# Patient Record
Sex: Male | Born: 1945 | Race: White | Hispanic: No | Smoking: Never smoker
Health system: Southern US, Community
[De-identification: ages and names within clinical notes are randomized; demographics above are authoritative.]

## PROBLEM LIST (undated history)

## (undated) DIAGNOSIS — E785 Hyperlipidemia, unspecified: Secondary | ICD-10-CM

## (undated) DIAGNOSIS — F909 Attention-deficit hyperactivity disorder, unspecified type: Secondary | ICD-10-CM

## (undated) DIAGNOSIS — T7840XA Allergy, unspecified, initial encounter: Secondary | ICD-10-CM

## (undated) DIAGNOSIS — N400 Enlarged prostate without lower urinary tract symptoms: Secondary | ICD-10-CM

## (undated) DIAGNOSIS — F329 Major depressive disorder, single episode, unspecified: Secondary | ICD-10-CM

## (undated) DIAGNOSIS — F32A Depression, unspecified: Secondary | ICD-10-CM

## (undated) HISTORY — PX: VASECTOMY: SHX75

## (undated) HISTORY — DX: Attention-deficit hyperactivity disorder, unspecified type: F90.9

## (undated) HISTORY — DX: Allergy, unspecified, initial encounter: T78.40XA

## (undated) HISTORY — DX: Major depressive disorder, single episode, unspecified: F32.9

## (undated) HISTORY — DX: Hyperlipidemia, unspecified: E78.5

## (undated) HISTORY — DX: Depression, unspecified: F32.A

---

## 1970-12-14 HISTORY — PX: FRACTURE SURGERY: SHX138

## 2008-09-28 ENCOUNTER — Emergency Department (HOSPITAL_COMMUNITY): Admission: EM | Admit: 2008-09-28 | Discharge: 2008-09-28 | Payer: Self-pay | Admitting: Emergency Medicine

## 2010-09-26 ENCOUNTER — Encounter: Admission: RE | Admit: 2010-09-26 | Discharge: 2010-09-26 | Payer: Self-pay | Admitting: Emergency Medicine

## 2011-09-15 LAB — URINALYSIS, MICROSCOPIC ONLY
Glucose, UA: NEGATIVE
Ketones, ur: NEGATIVE
Protein, ur: NEGATIVE

## 2011-09-15 LAB — CBC
HCT: 47.4
MCV: 86.2
Platelets: 202
RDW: 14.4

## 2011-09-15 LAB — RAPID URINE DRUG SCREEN, HOSP PERFORMED
Benzodiazepines: NOT DETECTED
Cocaine: NOT DETECTED
Tetrahydrocannabinol: NOT DETECTED

## 2011-09-15 LAB — POCT I-STAT, CHEM 8
BUN: 13
Chloride: 108
Creatinine, Ser: 1.2
Glucose, Bld: 113 — ABNORMAL HIGH
Potassium: 3.9

## 2011-09-15 LAB — ETHANOL: Alcohol, Ethyl (B): 184 — ABNORMAL HIGH

## 2012-01-02 ENCOUNTER — Ambulatory Visit (INDEPENDENT_AMBULATORY_CARE_PROVIDER_SITE_OTHER): Payer: BC Managed Care – PPO

## 2012-01-02 DIAGNOSIS — R21 Rash and other nonspecific skin eruption: Secondary | ICD-10-CM

## 2012-01-02 DIAGNOSIS — R079 Chest pain, unspecified: Secondary | ICD-10-CM

## 2012-02-24 ENCOUNTER — Other Ambulatory Visit: Payer: Self-pay | Admitting: Internal Medicine

## 2012-04-05 ENCOUNTER — Ambulatory Visit (INDEPENDENT_AMBULATORY_CARE_PROVIDER_SITE_OTHER): Payer: BC Managed Care – PPO | Admitting: Internal Medicine

## 2012-04-05 VITALS — BP 122/74 | HR 52 | Temp 97.6°F | Resp 18 | Ht 68.25 in | Wt 154.2 lb

## 2012-04-05 DIAGNOSIS — F411 Generalized anxiety disorder: Secondary | ICD-10-CM

## 2012-04-05 DIAGNOSIS — N400 Enlarged prostate without lower urinary tract symptoms: Secondary | ICD-10-CM

## 2012-04-05 MED ORDER — TAMSULOSIN HCL 0.4 MG PO CAPS: 0.4000 mg | ORAL_CAPSULE | Freq: Every day | ORAL | Status: DC

## 2012-04-05 MED ORDER — ALPRAZOLAM 0.5 MG PO TABS: ORAL_TABLET | ORAL | Status: DC

## 2012-04-05 NOTE — Progress Notes (Signed)
  Subjective:    Patient ID: Thomas Mcbride, male    DOB: May 03, 1946, 66 y.o.   MRN: [id number]  HPIwife left him 3 days ago probably for another man/after 40 years Angry To the point of interfering with activity Feels grief/hypervent to point of carpopedal spasm Can't go to work/Afraid he'll break down was controlled with customers Grown kids-He is reluctant to discuss this with him Had problems about 10 or 15 years ago with another affair but he forgave her  History of depression the Wellbutrin in the past/he would prefer not to restart medicines at this point   Review of SystemsMedical problems hyperlipidemia and BPH, both stable     Objective:   Physical ExamVital signs stable Affect appropriately flat Attitude is stable        Assessment & Plan:  Angry and anxiety secondary to domestic issues with panic attacks  Plan-alprazolam 0.5 mg #42 take half to one 3 times a day as needed Consider counseling Followup 2 weeks to discuss further  Also needs to change his Flomax prescription for 90 day supplies

## 2012-04-06 ENCOUNTER — Telehealth: Payer: Self-pay

## 2012-04-06 NOTE — Telephone Encounter (Signed)
She is in a difficult position/her only recourse would be to discuss this with police to see if she needs to take out a restraining order/if she feels he is a danger to himself, she could ask them to commithim in voluntarily,Although she would have to appear before the magistrate to get this order/if he would agree to go to the behavioral [facility])Then she could tak him there, but he would have to agree

## 2012-04-06 NOTE — Telephone Encounter (Signed)
Spoke with Thomas Mcbride and she is concerned with her husband and his diagnosis from yesterday. She states he came to her house and drove by four times. She is concerned because she has a daycare and thinks he may be harm to others or himself. I told her we probably couldn't get him commited and that it would be up to the behavioral center to keep him there or not. She wants to know if he has appt to see a psychiatrist (this is what he told his family). Im not sure what Thomas Mcbride wants [place] to do right now, she is unsure of what to do. Can you advise me what to tell her.

## 2012-04-07 NOTE — Telephone Encounter (Signed)
Unable to reach.

## 2012-04-07 NOTE — Telephone Encounter (Signed)
Spoke with patient and let her know that we could not have him committed that she would need to call the police if she fears him.

## 2012-04-08 ENCOUNTER — Telehealth: Payer: Self-pay

## 2012-04-08 NOTE — Telephone Encounter (Signed)
PT WOULD LIKE RX FOR WELLBUTRIN.  BEST NUMBER IS [phone]

## 2013-02-23 ENCOUNTER — Telehealth: Payer: Self-pay

## 2013-02-23 NOTE — Telephone Encounter (Signed)
Patient would like to talk to a nurse about his irritable stomache and his nerve issues please call him at [phone]

## 2013-02-24 ENCOUNTER — Ambulatory Visit (INDEPENDENT_AMBULATORY_CARE_PROVIDER_SITE_OTHER): Payer: BC Managed Care – PPO | Admitting: Emergency Medicine

## 2013-02-24 ENCOUNTER — Ambulatory Visit: Payer: BC Managed Care – PPO

## 2013-02-24 VITALS — BP 120/81 | HR 69 | Temp 98.6°F | Resp 17 | Ht 68.2 in | Wt 147.0 lb

## 2013-02-24 DIAGNOSIS — T17308A Unspecified foreign body in larynx causing other injury, initial encounter: Secondary | ICD-10-CM

## 2013-02-24 DIAGNOSIS — K3189 Other diseases of stomach and duodenum: Secondary | ICD-10-CM

## 2013-02-24 DIAGNOSIS — T17320A Food in larynx causing asphyxiation, initial encounter: Secondary | ICD-10-CM

## 2013-02-24 DIAGNOSIS — K319 Disease of stomach and duodenum, unspecified: Secondary | ICD-10-CM

## 2013-02-24 MED ORDER — OMEPRAZOLE 40 MG PO CPDR: 40.0000 mg | DELAYED_RELEASE_CAPSULE | Freq: Every day | ORAL | Status: DC

## 2013-02-24 NOTE — Progress Notes (Signed)
  Subjective:    Patient ID: Thomas Mcbride, male    DOB: December 22, 1945, 67 y.o.   MRN: [id number]  HPI patient states he has been under a great deal of stress recently. His currently on Prozac daily. About 7 months ago he found out his wife was having an affair and he moved out. He states that when he eats and the food hits his stomach he has a salt sensation in his mouth and has gagging and vomited some yellowish-type material. He has a gripping sensation in his mid epigastrium. He states his weight is been stable he's been having normal bowel movements. He has never had any kind of GI screening.    Review of Systems     Objective:   Physical Exam HEENT exam is unremarkable. Neck is supple. Chest is clear to auscultation and percussion. Heart regular rate no murmurs rubs or gallops appreciated. Abdomen is flat liver and spleen are not enlarged there are no areas of tenderness no masses were felt. [facility] reading (PRIMARY) by  Dr.Maire Govan no acute disease        Assessment & Plan:  Will check a baseline chest x-ray. Have set him up to see Dr. Audley Hose. He would need endoscopy and colonoscopy. His symptoms are suspicious for an esophageal stricture or Schatzki ring

## 2013-02-24 NOTE — Telephone Encounter (Signed)
Spoke to patient and he states he is having trouble with his nerves and thinks this is causing him to experience trouble when eating. He feels like his food is causing his stomach to spasm. He has this occasionally, not daily this is more frequent sine his separation from his wife. Please advise.

## 2013-02-24 NOTE — Telephone Encounter (Signed)
Called patient to advise  °

## 2013-02-24 NOTE — Telephone Encounter (Signed)
Needs eval

## 2013-03-08 ENCOUNTER — Ambulatory Visit (INDEPENDENT_AMBULATORY_CARE_PROVIDER_SITE_OTHER): Payer: BC Managed Care – PPO | Admitting: Family Medicine

## 2013-03-08 VITALS — BP 128/82 | HR 67 | Temp 98.0°F | Resp 16 | Ht 67.0 in | Wt 146.4 lb

## 2013-03-08 DIAGNOSIS — L0231 Cutaneous abscess of buttock: Secondary | ICD-10-CM

## 2013-03-08 MED ORDER — DOXYCYCLINE HYCLATE 100 MG PO TABS: 100.0000 mg | ORAL_TABLET | Freq: Two times a day (BID) | ORAL | Status: DC

## 2013-03-08 NOTE — Patient Instructions (Signed)
Take doxycycline twice daily  If the place develops an abscess-like area come back.

## 2013-03-08 NOTE — Progress Notes (Signed)
Subjective: Patient has noticed a placed on his right buttock for the last several days. It started when he noticed an itching back there in the morning 3 days ago. There is a halo blood like material coming out of the lesion. It is continued to has discomfort and itching.  Objective: 5 cm area of erythema and induration but no central area of fluctuance. It feels like more a cellulitis than a abscess. It does have a central hole then some drainage is coming from, though nothing wants to express at this time  Assessment: Cellulitis right buttock, atypical appearance. I wondered whether he had gotten something stuck in himself there, though there is no evidence of that.  Plan: Doxycycline 100 mg twice a day. Return if it's getting all worse or developing more of an abscess-like area.

## 2013-05-02 ENCOUNTER — Ambulatory Visit (INDEPENDENT_AMBULATORY_CARE_PROVIDER_SITE_OTHER): Payer: BC Managed Care – PPO | Admitting: Emergency Medicine

## 2013-05-02 ENCOUNTER — Encounter: Payer: Self-pay | Admitting: Emergency Medicine

## 2013-05-02 ENCOUNTER — Other Ambulatory Visit: Payer: Self-pay | Admitting: Emergency Medicine

## 2013-05-02 VITALS — BP 140/83 | HR 55 | Temp 97.5°F | Resp 16 | Ht 67.0 in | Wt 146.0 lb

## 2013-05-02 DIAGNOSIS — N400 Enlarged prostate without lower urinary tract symptoms: Secondary | ICD-10-CM

## 2013-05-02 DIAGNOSIS — Z0000 Encounter for general adult medical examination without abnormal findings: Secondary | ICD-10-CM

## 2013-05-02 DIAGNOSIS — F32A Depression, unspecified: Secondary | ICD-10-CM

## 2013-05-02 DIAGNOSIS — Z23 Encounter for immunization: Secondary | ICD-10-CM

## 2013-05-02 DIAGNOSIS — F329 Major depressive disorder, single episode, unspecified: Secondary | ICD-10-CM

## 2013-05-02 DIAGNOSIS — E782 Mixed hyperlipidemia: Secondary | ICD-10-CM | POA: Insufficient documentation

## 2013-05-02 DIAGNOSIS — E785 Hyperlipidemia, unspecified: Secondary | ICD-10-CM

## 2013-05-02 LAB — POCT URINALYSIS DIPSTICK
Bilirubin, UA: NEGATIVE
Blood, UA: NEGATIVE
Nitrite, UA: NEGATIVE
Urobilinogen, UA: 0.2
pH, UA: 6

## 2013-05-02 LAB — COMPREHENSIVE METABOLIC PANEL
AST: 25 U/L (ref 0–37)
BUN: 18 mg/dL (ref 6–23)
Calcium: 9.6 mg/dL (ref 8.4–10.5)
Chloride: 101 mEq/L (ref 96–112)
Creat: 1.09 mg/dL (ref 0.50–1.35)

## 2013-05-02 LAB — LIPID PANEL
Cholesterol: 219 mg/dL — ABNORMAL HIGH (ref 0–200)
HDL: 71 mg/dL (ref >39)
LDL Cholesterol: 126 mg/dL — ABNORMAL HIGH (ref 0–99)
Triglycerides: 108 mg/dL (ref <150)

## 2013-05-02 LAB — IFOBT (OCCULT BLOOD): IFOBT: NEGATIVE

## 2013-05-02 MED ORDER — TAMSULOSIN HCL 0.4 MG PO CAPS: 0.4000 mg | ORAL_CAPSULE | Freq: Every day | ORAL | Status: DC

## 2013-05-02 MED ORDER — FLUOXETINE HCL 20 MG PO TABS: 20.0000 mg | ORAL_TABLET | Freq: Every day | ORAL | Status: DC

## 2013-05-02 NOTE — Patient Instructions (Signed)

## 2013-05-02 NOTE — Progress Notes (Deleted)
  Subjective:    Patient ID: Thomas Mcbride, male    DOB: Dec 24, 1945, 67 y.o.   MRN: [id number]  HPI    Review of Systems     Objective:   Physical Exam        Assessment & Plan:

## 2013-05-02 NOTE — Progress Notes (Signed)
@[facility] @  Patient ID: Thomas Mcbride MRN: [id number], DOB: Oct 21, 1946 67 y.o. Date of Encounter: 05/02/2013, 8:25 AM  Primary Physician: Lucilla Edin, MD  Chief Complaint: Physical (CPE)  HPI: 67 y.o. y/o male with history noted below here for CPE.  Doing well. No issues/complaints.  Review of Systems:  Consitutional: No fever, chills, fatigue, night sweats, lymphadenopathy, or weight changes. Eyes: No visual changes, eye redness, or discharge. ENT/Mouth: Ears: No otalgia, tinnitus, hearing loss, discharge. Nose: No congestion, rhinorrhea, sinus pain, or epistaxis. Throat: No sore throat, post nasal drip, or teeth pain. Cardiovascular: No CP, palpitations, diaphoresis, DOE, edema, orthopnea, PND. Respiratory: No cough, hemoptysis, SOB, or wheezing. Gastrointestinal: No anorexia, dysphagia, reflux, pain, nausea, vomiting, hematemesis, diarrhea, constipation, BRBPR, or melena. Genitourinary: No dysuria, frequency, urgency, hematuria, incontinence, nocturia, decreased urinary stream, discharge, impotence, or testicular pain/masses. Musculoskeletal: No decreased ROM, myalgias, stiffness, joint swelling, or weakness. Skin: No rash, erythema, lesion changes, pain, warmth, jaundice, or pruritis. Neurological: No headache, dizziness, syncope, seizures, tremors, memory loss, coordination problems, or paresthesias. Psychological: He currently works at [employer]. He went through a divorce over the last year and is now doing well. He continues on Prozac related to depression he had going through his separation and divorce.  Endocrine: No fatigue, polydipsia, polyphagia, polyuria, or known diabetes. He has a history of high cholesterol but had difficulty with hypoglycemia when he took statin drugs in the past All other systems were reviewed and are otherwise negative.  Past Medical History  Diagnosis Date  . Depression      Past Surgical History  Procedure Laterality Date  . Vasectomy    .  Fracture surgery      Home Meds:  Prior to Admission medications   Medication Sig Start Date End Date Taking? Authorizing Provider  Coenzyme Q10 (CO Q-10) 100 MG CAPS Take 1 tablet by mouth every other day.   Yes Historical Provider, MD  FLUoxetine (PROZAC) 20 MG tablet Take 20 mg by mouth daily.   Yes Historical Provider, MD  Multiple Vitamins-Minerals ([employer] SPECTRAVITE SENIOR PO) Take 1 tablet by mouth daily.   Yes Historical Provider, MD  Tamsulosin HCl (FLOMAX) 0.4 MG CAPS Take 1 capsule (0.4 mg total) by mouth daily. 04/05/12  Yes Tonye Pearson, MD  ALPRAZolam Prudy Feeler) 0.5 MG tablet Half to one tid for anxiety control 04/05/12   Tonye Pearson, MD  doxycycline (VIBRA-TABS) 100 MG tablet Take 1 tablet (100 mg total) by mouth 2 (two) times daily. 03/08/13   Peyton Najjar, MD  omeprazole (PRILOSEC) 40 MG capsule Take 1 capsule (40 mg total) by mouth daily. 02/24/13   [name], MD  simvastatin (ZOCOR) 10 MG tablet Take 10 mg by mouth at bedtime.    Historical Provider, MD    Allergies: No Known Allergies  History   Social History  . Marital Status: Legally Separated    Spouse Name: N/A    Number of Children: N/A  . Years of Education: N/A   Occupational History  . Not on file.   Social History Main Topics  . Smoking status: Never Smoker   . Smokeless tobacco: Not on file  . Alcohol Use: No  . Drug Use: No  . Sexually Active: No   Other Topics Concern  . Not on file   Social History Narrative  . No narrative on file    Family History  Problem Relation Age of Onset  . Crohn's disease Mother   . Cancer Mother   .  Heart attack Father   . Lymphoma Father   . COPD Paternal Grandfather     Physical Exam: Blood pressure 142/90, pulse 55, temperature 97.5 F (36.4 C), temperature source Oral, resp. rate 16, height 5\' 7"  (1.702 m), weight 146 lb (66.225 kg).  General: Well developed, well nourished, in no acute distress. HEENT: Normocephalic, atraumatic.  Conjunctiva pink, sclera non-icteric. Pupils 2 mm constricting to 1 mm, round, regular, and equally reactive to light and accomodation. EOMI. Internal auditory canal clear. TMs with good cone of light and without pathology. Nasal mucosa pink. Nares are without discharge. No sinus tenderness. Oral mucosa pink. Dentition. Pharynx without exudate.   Neck: Supple. Trachea midline. No thyromegaly. Full ROM. No lymphadenopathy. Lungs: Clear to auscultation bilaterally without wheezes, rales, or rhonchi. Breathing is of normal effort and unlabored. Cardiovascular: RRR with S1 S2. No murmurs, rubs, or gallops appreciated. Distal pulses 2+ symmetrically. No carotid or abdominal bruits. Abdomen: Soft, non-tender, non-distended with normoactive bowel sounds. No hepatosplenomegaly or masses. No rebound/guarding. No CVA tenderness. Without hernias.  Rectal: No external hemorrhoids or fissures. Rectal vault without masses.   Genitourinary:   circumcised male. No penile lesions. Testes descended bilaterally, and smooth without tenderness or masses.  Musculoskeletal: Full range of motion and 5/5 strength throughout. Without swelling, atrophy, tenderness, crepitus, or warmth. Extremities without clubbing, cyanosis, or edema. Calves supple. Skin: Warm and moist without erythema, ecchymosis, wounds, or rash. Neuro: A+Ox3. CN II-XII grossly intact. Moves all extremities spontaneously. Full sensation throughout. Normal gait. DTR 2+ throughout upper and lower extremities. Finger to nose intact. Psych:  Responds to questions appropriately with a normal affect.  There is a 0.5 x 0.5 cm raised red area lateral portion of the right side of the pelvis. Studies: CBC, CMET, Lipid, PSA,  all pending.        Assessment/Plan:  67 y.o. y/o white male here for physical exam. Routine labs were done. He is updated on his Tdap. Once I get back his lipid panel we will make a decision as to whether he should be on a statin. He is  willing to try this again. He was given a prescription for shingles vaccine. There is a small 4 mm cystic red area on the right side of the pelvis on the skin and if this continues to be present we will remove it by punch biopsy    -  Signed, Earl Lites, MD 05/02/2013 8:25 AM

## 2013-05-09 ENCOUNTER — Other Ambulatory Visit: Payer: Self-pay | Admitting: Radiology

## 2013-05-09 DIAGNOSIS — E785 Hyperlipidemia, unspecified: Secondary | ICD-10-CM

## 2013-05-09 MED ORDER — ATORVASTATIN CALCIUM 20 MG PO TABS: 20.0000 mg | ORAL_TABLET | Freq: Every day | ORAL | Status: DC

## 2013-10-13 ENCOUNTER — Telehealth: Payer: Self-pay | Admitting: *Deleted

## 2013-10-13 NOTE — Telephone Encounter (Signed)
Called and left message for patient to inform him that Tramadol script is ready for pick up at our front desk. Medication contract needs to be signed and UDS

## 2013-12-14 ENCOUNTER — Ambulatory Visit (INDEPENDENT_AMBULATORY_CARE_PROVIDER_SITE_OTHER): Payer: BC Managed Care – PPO | Admitting: Family Medicine

## 2013-12-14 VITALS — BP 128/72 | HR 52 | Temp 98.2°F | Resp 14 | Ht 69.0 in | Wt 148.0 lb

## 2013-12-14 DIAGNOSIS — J0100 Acute maxillary sinusitis, unspecified: Secondary | ICD-10-CM

## 2013-12-14 MED ORDER — AMOXICILLIN-POT CLAVULANATE 875-125 MG PO TABS: 1.0000 | ORAL_TABLET | Freq: Two times a day (BID) | ORAL | Status: DC

## 2013-12-14 MED ORDER — IPRATROPIUM BROMIDE 0.03 % NA SOLN: 2.0000 | Freq: Two times a day (BID) | NASAL | Status: DC

## 2013-12-14 NOTE — Patient Instructions (Signed)

## 2013-12-14 NOTE — Progress Notes (Signed)
Subjective:    Patient ID: Thomas Mcbride, male    DOB: 03/13/1946, 68 y.o.   MRN: [id number]  HPI Scribed for Nilda SimmerKristi Early MD, the patient was seen in room 3. This chart was scribed by Lewanda RifeAlexandra Hurtado, ED scribe. Patient's care was started at 11:41 AM  HPI Comments: Hx was provided by pt. Thomas Mcbride is a 68 y.o. male who presents to the [facility] complaining of constant worsening general malaise onset 12 days. Describes malaise and moderate in severity. Reports associated sinus pressure, mild cough, low back pain, chills, right sided otalgia, and intermittent congestion with yellow discharge. Denies any aggravating or alleviating factors. Reports trying Robitussin with no relief of symptoms. Denies associated fever, sore throat, and general myalgias.  Past Medical History  Diagnosis Date   Depression     Past Surgical History  Procedure Laterality Date   Vasectomy     Fracture surgery      Family History  Problem Relation Age of Onset   Crohn's disease Mother    Cancer Mother    Heart attack Father    Lymphoma Father    COPD Paternal Grandfather     History   Social History   Marital Status: Legally Separated    Spouse Name: N/A    Number of Children: N/A   Years of Education: N/A   Occupational History   CUSTOMER SALES [employer]   Social History Main Topics   Smoking status: Never Smoker    Smokeless tobacco: Not on file   Alcohol Use: No   Drug Use: No   Sexual Activity: No   Other Topics Concern   Not on file   Social History Narrative   Exercises 3 x's week on eliptical machine.          No Known Allergies  Patient Active Problem List   Diagnosis Date Noted   Other and unspecified hyperlipidemia 05/02/2013      Review of Systems  Constitutional: Negative for fever.  HENT: Positive for congestion, sinus pressure and sore throat.   Musculoskeletal: Positive for back pain.  Psychiatric/Behavioral:  Negative for confusion.   A complete 10 system review of systems was obtained and all systems are negative except as noted in the HPI and PMHx.       Objective:   Physical Exam  Nursing note and vitals reviewed. Constitutional: He is oriented to person, place, and time. He appears well-developed and well-nourished. No distress.  HENT:  Head: Normocephalic and atraumatic.  Right Ear: Tympanic membrane, external ear and ear canal normal.  Left Ear: Tympanic membrane and external ear normal.  Nose: No mucosal edema or rhinorrhea. Right sinus exhibits maxillary sinus tenderness. Right sinus exhibits no frontal sinus tenderness. Left sinus exhibits maxillary sinus tenderness. Left sinus exhibits no frontal sinus tenderness.  Mouth/Throat: Uvula is midline, oropharynx is clear and moist and mucous membranes are normal. Mucous membranes are not dry. No uvula swelling. No oropharyngeal exudate, posterior oropharyngeal edema, posterior oropharyngeal erythema or tonsillar abscesses.  Mild post-nasal drip present   Eyes: Conjunctivae and EOM are normal. Pupils are equal, round, and reactive to light. No scleral icterus.  Neck: Normal range of motion. Neck supple. No tracheal deviation present.  Cardiovascular: Normal rate and regular rhythm.   No murmur heard. Pulmonary/Chest: Effort normal and breath sounds normal. No respiratory distress. He has no wheezes. He has no rales.  Musculoskeletal: Normal range of motion.  Lymphadenopathy:    He has  no cervical adenopathy.  Neurological: He is alert and oriented to person, place, and time.  Skin: Skin is warm and dry. He is not diaphoretic.  Psychiatric: He has a normal mood and affect. His behavior is normal.    DIAGNOSTIC STUDIES: Oxygen Saturation is 97% on room air, normal by my interpretation.    COORDINATION OF CARE:  Nursing notes reviewed. Vital signs reviewed. Initial pt interview and examination performed.   11:49 AM-Discussed  treatment plan with pt at bedside. Pt agrees with plan.   Treatment plan initiated:Medications - No data to display   Initial diagnostic testing ordered.    1. Acute maxillary sinusitis         Assessment & Plan:  Acute maxillary sinusitis  1. Acute Maxillary Sinusitis: New.  Rx for Augmentin, Atrovent nasal spray provided.    Meds ordered this encounter  Medications   amoxicillin-clavulanate (AUGMENTIN) 875-125 MG per tablet    Sig: Take 1 tablet by mouth 2 (two) times daily.    Dispense:  20 tablet    Refill:  0   ipratropium (ATROVENT) 0.03 % nasal spray    Sig: Place 2 sprays into the nose 2 (two) times daily.    Dispense:  30 mL    Refill:  0    I personally performed the services described in this documentation, which was scribed in my presence.  The recorded information has been reviewed and is accurate.  Nilda Simmer, M.D.  [facility] [address] [place], [place]  [address] [phone] phone [phone] fax

## 2014-02-15 ENCOUNTER — Other Ambulatory Visit: Payer: Self-pay | Admitting: Emergency Medicine

## 2014-05-07 ENCOUNTER — Other Ambulatory Visit: Payer: Self-pay | Admitting: Emergency Medicine

## 2014-05-14 ENCOUNTER — Other Ambulatory Visit: Payer: Self-pay | Admitting: Emergency Medicine

## 2014-06-22 ENCOUNTER — Ambulatory Visit (INDEPENDENT_AMBULATORY_CARE_PROVIDER_SITE_OTHER): Payer: BC Managed Care – PPO | Admitting: Emergency Medicine

## 2014-06-22 VITALS — BP 108/52 | HR 49 | Temp 97.8°F | Resp 16 | Ht 69.25 in | Wt 158.2 lb

## 2014-06-22 DIAGNOSIS — F3289 Other specified depressive episodes: Secondary | ICD-10-CM

## 2014-06-22 DIAGNOSIS — F32A Depression, unspecified: Secondary | ICD-10-CM | POA: Insufficient documentation

## 2014-06-22 DIAGNOSIS — N400 Enlarged prostate without lower urinary tract symptoms: Secondary | ICD-10-CM

## 2014-06-22 DIAGNOSIS — F329 Major depressive disorder, single episode, unspecified: Secondary | ICD-10-CM

## 2014-06-22 MED ORDER — FLUOXETINE HCL 10 MG PO CAPS: ORAL_CAPSULE | ORAL | Status: DC

## 2014-06-22 MED ORDER — TAMSULOSIN HCL 0.4 MG PO CAPS: 0.4000 mg | ORAL_CAPSULE | Freq: Every day | ORAL | Status: DC

## 2014-06-22 NOTE — Progress Notes (Signed)
   Subjective:    Patient ID: Thomas Mcbride, male    DOB: 11/11/1946, 68 y.o.   MRN: [id number] This chart was scribed for Lesle ChrisSteven Mirely Pangle, MD by Nicholos Johnsenise Iheanachor, Medical Scribe. The patient was seen in room 2. This patient's care was started at 10:55 AM.  HPI HPI Comments: Thomas MatterDonald Weekly is a 68 y.o. male who presents to the [facility] for medication refill of Fluoxetine HCl 20 mg.  Does not feel as if Fluoxetine is working for him. Says he hs been on for over a year. Feeling more lethargic and uninterested in doing anything for the past 2-3 months. Says he exercises 2-3 times week at the gym but otherwise no other hobbies or activities he performs regularly. Says he is trying to involve himself in his community more since getting divorced. Was taking Wellbutrin at first but changed over to Fluoxetine due to lone Wellbutrin sxs. Does report he felt as if there was a perfect balance at the time he switched over from Wellbutrin to Fluoxetine and both drugs were in his system. Reports ADD sxs were even diminished at that time. Has not felt that way being diagnosed with depression. Last full PE was May 2014  Review of Systems  Psychiatric/Behavioral: Negative.    Objective:  Physical Exam CONSTITUTIONAL: Well developed/well nourished HEAD: Normocephalic/atraumatic EYES: EOMI/PERRL ENMT: Mucous membranes moist NECK: supple no meningeal signs SPINE:entire spine nontender CV: S1/S2 noted, no murmurs/rubs/gallops noted LUNGS: Lungs are clear to auscultation bilaterally, no apparent distress ABDOMEN: soft, nontender, no rebound or guarding GU:no cva tenderness NEURO: Pt is awake/alert, moves all extremitiesx4 EXTREMITIES: pulses normal, full ROM SKIN: warm, color normal PSYCH: no abnormalities of mood noted  Assessment & Plan:  Patient states his Prozac is not as effective as previously. Interested in increasing the dosage. Will increase to 30 mg daily. Advised to make  appointment for physical.   I personally performed the services described in this documentation, which was scribed in my presence. The recorded information has been reviewed and is accurate.

## 2014-07-25 ENCOUNTER — Other Ambulatory Visit: Payer: Self-pay | Admitting: Emergency Medicine

## 2014-07-27 ENCOUNTER — Telehealth: Payer: Self-pay

## 2014-07-27 NOTE — Telephone Encounter (Signed)
°  Pt is needing refill on cyclobenzaprine (FLEXERIL) 10 MG tablet [[id number]][[id number]] , [facility] is saying they do not have it

## 2014-07-27 NOTE — Telephone Encounter (Signed)
Dr Cleta Albertsaub, I do not see this med on pt's med list or that you have Rxd it before. Do you Rx this for pt, or want to?

## 2014-07-28 MED ORDER — FLUOXETINE HCL 10 MG PO CAPS: ORAL_CAPSULE | ORAL | Status: DC

## 2014-07-28 NOTE — Telephone Encounter (Signed)
Please call and get more information regarding the Flexeril. He may have been given that at sometime in the past but get a better history of what problem is going on now that he is requesting this medication.

## 2014-07-28 NOTE — Telephone Encounter (Signed)
Pt needs a RF of Fluoxetine not Flexeril. Per Maralyn SagoSarah, ok to RF until pt's appt on 10/20.

## 2014-08-04 ENCOUNTER — Other Ambulatory Visit: Payer: Self-pay | Admitting: Emergency Medicine

## 2014-08-06 NOTE — Telephone Encounter (Signed)
Pt is very overdue for f/up, but has CPE sch for 10/02/14. Do you want to RF through then? Pended.

## 2014-08-16 DIAGNOSIS — F902 Attention-deficit hyperactivity disorder, combined type: Secondary | ICD-10-CM | POA: Insufficient documentation

## 2014-08-18 ENCOUNTER — Ambulatory Visit (INDEPENDENT_AMBULATORY_CARE_PROVIDER_SITE_OTHER): Payer: Medicare Other | Admitting: Emergency Medicine

## 2014-08-18 VITALS — BP 142/72 | HR 52 | Temp 98.3°F | Resp 16 | Ht 67.25 in | Wt 147.4 lb

## 2014-08-18 DIAGNOSIS — F32A Depression, unspecified: Secondary | ICD-10-CM

## 2014-08-18 DIAGNOSIS — F3289 Other specified depressive episodes: Secondary | ICD-10-CM

## 2014-08-18 DIAGNOSIS — E785 Hyperlipidemia, unspecified: Secondary | ICD-10-CM

## 2014-08-18 DIAGNOSIS — F329 Major depressive disorder, single episode, unspecified: Secondary | ICD-10-CM

## 2014-08-18 MED ORDER — ATORVASTATIN CALCIUM 20 MG PO TABS: ORAL_TABLET | ORAL | Status: DC

## 2014-08-18 MED ORDER — FLUOXETINE HCL 10 MG PO CAPS: ORAL_CAPSULE | ORAL | Status: DC

## 2014-08-18 NOTE — Progress Notes (Addendum)
   Subjective:    Patient ID: Thomas Mcbride, male    DOB: [birth year], 68 y.o.   MRN: [id number]  This chart was scribed for Lesle Chris, MD by Jarvis Morgan, Medical Scribe. This patient was seen in Room 8 and the patient's care was started at 9:42 AM.  Chief Complaint  Patient presents with  . Depression    Has been having a rough time emotionally recently- would like to discuss possible change in medication   . ADHD    Saw a specialist at [facility] and would like to discuss what he recommends      HPI HPI Comments: Thomas Mcbride is a 68 y.o. male with a h/o depression and hyperlipidemia who presents to [facility] to discuss his depression medications. He states that Medicare has not been covering his prescriptions due to his income. He also notes he has not shown any improvement with the 10 mg dose increase of his Prozac medication. At his last visit his Prozac dose was increased from 20-30 mg. He has previously taken Wellbutrin for his depression issues and began having some side effects. He reports he felt like he was getting hypoglycemic and feeling really weak. When he would eat he states that the feeling would go away. He says he has been seeing a specialist at [facility] for his ADHD and depression, Dr, Veneda Melter. Dr. Alvina Filbert recommended him to maybe drop back down to 20 mg Prozac and then also try taking Wellbutrin again. This specialist believes that the hypoglycemia was an atypical side effect and if it was tried again it was possible he may not have that side effect again. Pt reports he has really good results from Wellbutrin in terms of helping his depression. He reports that his family members have noticed some increased depression symptoms. He states that he has noticed feeling more fatigued, withdrawn and just overall not wanting to do anything. He wanted to discuss this with Dr. Cleta Alberts. Pt is also taking Lipitor for his hyperlipidemia and needs to get a new prescription for that.       Review of Systems  Constitutional: Positive for fatigue (due to depression).  Gastrointestinal: Negative for nausea, vomiting and diarrhea.  Neurological: Negative for dizziness, weakness, light-headedness and headaches.  Psychiatric/Behavioral: Positive for dysphoric mood. Negative for confusion and sleep disturbance.       Intermittent anhedonia due to depression       Objective:   Physical Exam CONSTITUTIONAL: Well developed/well nourished HEAD: Normocephalic/atraumatic EYES: EOMI/PERRL ENMT: Mucous membranes moist NECK: supple no meningeal signs SPINE:entire spine nontender CV: S1/S2 noted, no murmurs/rubs/gallops noted LUNGS: Lungs are clear to auscultation bilaterally, no apparent distress ABDOMEN: soft, nontender, no rebound or guarding GU:no cva tenderness NEURO: Pt is awake/alert, moves all extremitiesx4 EXTREMITIES: pulses normal, full ROM SKIN: warm, color normal PSYCH: no abnormalities of mood noted      Assessment & Plan:  I refilled the patient's Lipitor. Decrease Prozac to 20 mg per day. Advised patient to see Dr. Donell Beers psychiatry  to discuss the use of Wellbutrin with Prozac. Patient advised of the increased seizure risk with this combination.

## 2014-08-18 NOTE — Patient Instructions (Signed)
Please make an appointment to see Dr. Donell Beers [phone]. Decrease your prozac to  a day. Continue lipitor. Keep your appointment in October.

## 2014-08-26 ENCOUNTER — Emergency Department (HOSPITAL_COMMUNITY)
Admission: EM | Admit: 2014-08-26 | Discharge: 2014-08-26 | Disposition: A | Discharge status: Home / Self Care | Payer: Medicare Other | Attending: Emergency Medicine | Admitting: Emergency Medicine

## 2014-08-26 ENCOUNTER — Emergency Department (HOSPITAL_COMMUNITY): Payer: Medicare Other

## 2014-08-26 ENCOUNTER — Encounter (HOSPITAL_COMMUNITY): Payer: Self-pay | Admitting: Emergency Medicine

## 2014-08-26 DIAGNOSIS — R079 Chest pain, unspecified: Secondary | ICD-10-CM | POA: Insufficient documentation

## 2014-08-26 DIAGNOSIS — Z79899 Other long term (current) drug therapy: Secondary | ICD-10-CM | POA: Diagnosis not present

## 2014-08-26 DIAGNOSIS — E785 Hyperlipidemia, unspecified: Secondary | ICD-10-CM | POA: Insufficient documentation

## 2014-08-26 DIAGNOSIS — R110 Nausea: Secondary | ICD-10-CM | POA: Diagnosis not present

## 2014-08-26 DIAGNOSIS — Z7982 Long term (current) use of aspirin: Secondary | ICD-10-CM | POA: Insufficient documentation

## 2014-08-26 DIAGNOSIS — F329 Major depressive disorder, single episode, unspecified: Secondary | ICD-10-CM | POA: Diagnosis not present

## 2014-08-26 DIAGNOSIS — R61 Generalized hyperhidrosis: Secondary | ICD-10-CM | POA: Insufficient documentation

## 2014-08-26 DIAGNOSIS — F3289 Other specified depressive episodes: Secondary | ICD-10-CM | POA: Diagnosis not present

## 2014-08-26 DIAGNOSIS — R42 Dizziness and giddiness: Secondary | ICD-10-CM | POA: Insufficient documentation

## 2014-08-26 LAB — CBC
HCT: 42.6 % (ref 39.0–52.0)
Hemoglobin: 14.2 g/dL (ref 13.0–17.0)
MCH: 28.3 pg (ref 26.0–34.0)
MCHC: 33.3 g/dL (ref 30.0–36.0)
MCV: 84.9 fL (ref 78.0–100.0)
Platelets: 215 10*3/uL (ref 150–400)
RBC: 5.02 MIL/uL (ref 4.22–5.81)
RDW: 13.7 % (ref 11.5–15.5)
WBC: 6.1 10*3/uL (ref 4.0–10.5)

## 2014-08-26 LAB — BASIC METABOLIC PANEL
Anion gap: 15 (ref 5–15)
BUN: 20 mg/dL (ref 6–23)
CO2: 24 meq/L (ref 19–32)
Calcium: 9.1 mg/dL (ref 8.4–10.5)
Chloride: 99 mEq/L (ref 96–112)
Creatinine, Ser: 0.95 mg/dL (ref 0.50–1.35)
GFR calc non Af Amer: 85 mL/min — ABNORMAL LOW (ref >90)
Glucose, Bld: 144 mg/dL — ABNORMAL HIGH (ref 70–99)
Potassium: 4.1 mEq/L (ref 3.7–5.3)
Sodium: 138 mEq/L (ref 137–147)

## 2014-08-26 LAB — I-STAT TROPONIN, ED: Troponin i, poc: 0 ng/mL (ref 0.00–0.08)

## 2014-08-26 NOTE — ED Notes (Signed)
Pt reports that he was at work today and about 1030 this morning he started feeling light-headed, had chest pain and nauseated. Reports no cardiac hx but reports that his father had cardiac hx. No pain at this time

## 2014-08-26 NOTE — ED Provider Notes (Signed)
CSN: [id number]     Arrival date & time 08/26/14  1116 History   First MD Initiated Contact with Patient 08/26/14 1152     Chief Complaint  Patient presents with  . Chest Pain     (Consider location/radiation/quality/duration/timing/severity/associated sxs/prior Treatment) HPI Thomas Mcbride is a 68 y.o. male with history of hyperlipidemia and depression, who presents to ED with complaint of chest pain. Pt states yesterday, he was scraping off tiles with left hand what developed sudden onset of diaphoresis and dizziness, with nausea. States he stopped and his symptoms resolved. Today pt was standing at work, when he developed similar episode but this time with some pain in left shoulder. Pt states he became concerned and decided to come to ED. States his father had MI in his 36s. He denies any known cardiac problems. He took aspirin and came to ED. Currently asymptomatic. Pt denies smoking. He is not a drinker. States used to exercise strenuously, stopped because of some depression issues. Also reports starting Flomax yesterday. Took one dose yesterday and one today.    Past Medical History  Diagnosis Date  . Depression   . Attention deficit hyperactivity disorder (ADHD)    Past Surgical History  Procedure Laterality Date  . Vasectomy    . Fracture surgery     Family History  Problem Relation Age of Onset  . Crohn's disease Mother   . Cancer Mother   . Heart attack Father   . Lymphoma Father   . COPD Paternal Grandfather    History  Substance Use Topics  . Smoking status: Never Smoker   . Smokeless tobacco: Not on file  . Alcohol Use: No    Review of Systems  Constitutional: Positive for diaphoresis. Negative for fever and chills.  Respiratory: Positive for chest tightness. Negative for cough and shortness of breath.   Cardiovascular: Positive for chest pain. Negative for palpitations and leg swelling.  Gastrointestinal: Positive for nausea. Negative for vomiting, abdominal  pain, diarrhea and abdominal distention.  Genitourinary: Negative for dysuria.  Musculoskeletal: Negative for arthralgias, myalgias, neck pain and neck stiffness.  Skin: Negative for rash.  Allergic/Immunologic: Negative for immunocompromised state.  Neurological: Positive for dizziness and light-headedness. Negative for weakness, numbness and headaches.      Allergies  Review of patient's allergies indicates no known allergies.  Home Medications   Prior to Admission medications   Medication Sig Start Date End Date Taking? Authorizing Provider  aspirin EC 81 MG tablet Take 81 mg by mouth daily.   Yes Historical Provider, MD  atorvastatin (LIPITOR) 20 MG tablet Take 20 mg by mouth daily.   Yes Historical Provider, MD  Coenzyme Q10 (CO Q-10) 100 MG CAPS Take 1 tablet by mouth daily.    Yes Historical Provider, MD  FLUoxetine (PROZAC) 20 MG capsule Take 20 mg by mouth daily.   Yes Historical Provider, MD  L-TYROSINE PO Take 1 tablet by mouth daily.   Yes Historical Provider, MD  Multiple Vitamins-Minerals ([employer] SPECTRAVITE SENIOR PO) Take 1 tablet by mouth daily.   Yes Historical Provider, MD  tamsulosin (FLOMAX) 0.4 MG CAPS capsule Take 1 capsule (0.4 mg total) by mouth daily. 06/22/14  Yes [name], MD   BP 139/83  Pulse 51  Temp(Src) 98.1 F (36.7 C) (Oral)  Resp 11  SpO2 99% Physical Exam  Nursing note and vitals reviewed. Constitutional: He appears well-developed and well-nourished. No distress.  HENT:  Head: Normocephalic and atraumatic.  Eyes: Conjunctivae are normal.  Neck: Neck supple.  Cardiovascular: Normal rate, regular rhythm, normal heart sounds and intact distal pulses.   Pulmonary/Chest: Effort normal. No respiratory distress. He has no wheezes. He has no rales. He exhibits no tenderness.  Abdominal: Soft. Bowel sounds are normal. He exhibits no distension. There is no tenderness. There is no rebound.  Musculoskeletal: He exhibits no edema.  Neurological: He  is alert.  Skin: Skin is warm and dry.    ED Course  Procedures (including critical care time) Labs Review Labs Reviewed  BASIC METABOLIC PANEL - Abnormal; Notable for the following:    Glucose, Bld 144 (*)    GFR calc non Af Amer 85 (*)    All other components within normal limits  CBC  PRO B NATRIURETIC PEPTIDE  I-STAT TROPOININ, ED    Imaging Review Dg Chest 2 View  08/26/2014   CLINICAL DATA:  Chest pain radiating to left arm and fingers  EXAM: CHEST  2 VIEW  COMPARISON:  Chest radiograph 02/24/2013  FINDINGS: The heart, mediastinal contours, and hilar contours are within normal limits. Pulmonary vascularity is normal. Lungs are well expanded and clear. No acute or suspicious osseous abnormality. Chronic degenerative changes of the left shoulder noted.  IMPRESSION: No active cardiopulmonary disease.   Electronically Signed   By: Britta Mccreedy M.D.   On: 08/26/2014 13:12     EKG Interpretation None      MDM   Final diagnoses:  Chest pain, unspecified chest pain type    Patient with somewhat atypical chest pain. Currently chest pain-free. She is bradycardic, otherwise normal vital signs. EKG normal shows sinus bradycardia. Will check labs, chest x-ray, most likely symptoms due to Flomax but will need to do cardiac workup given family history of early heart disease.   2:40 PM Labs: Troponin, chest x-ray unremarkable. EKG normal. Discussed with Dr. Effie Shy who has seen patient and who believes that patient is stable for discharge home at this time.  Patient's heart score is 3, he is considered to be low risk for cardiac disease. Patient has been pain-free. He will stop Flomax. He will followup with his primary care Dr. He is instructed to return to emergency department if his symptoms worsen.  Filed Vitals:   08/26/14 1330 08/26/14 1345 08/26/14 1350 08/26/14 1400  BP: 128/88 144/78 144/78 139/83  Pulse: 53 51  51  Temp:      TempSrc:      Resp: SpO2: 99% 99%  99% 99%      Lottie Mussel, PA-C 08/26/14 1555

## 2014-08-26 NOTE — Discharge Instructions (Signed)
Continue aspirin daily. Follow up with primary care doctor for recheck as soon as able. Return if worsening.    Chest Pain (Nonspecific) It is often hard to give a specific diagnosis for the cause of chest pain. There is always a chance that your pain could be related to something serious, such as a heart attack or a blood clot in the lungs. You need to follow up with your health care provider for further evaluation. CAUSES   Heartburn.  Pneumonia or bronchitis.  Anxiety or stress.  Inflammation around your heart (pericarditis) or lung (pleuritis or pleurisy).  A blood clot in the lung.  A collapsed lung (pneumothorax). It can develop suddenly on its own (spontaneous pneumothorax) or from trauma to the chest.  Shingles infection (herpes zoster virus). The chest wall is composed of bones, muscles, and cartilage. Any of these can be the source of the pain.  The bones can be bruised by injury.  The muscles or cartilage can be strained by coughing or overwork.  The cartilage can be affected by inflammation and become sore (costochondritis). DIAGNOSIS  Lab tests or other studies may be needed to find the cause of your pain. Your health care provider may have you take a test called an ambulatory electrocardiogram (ECG). An ECG records your heartbeat patterns over a 24-hour period. You may also have other tests, such as:  Transthoracic echocardiogram (TTE). During echocardiography, sound waves are used to evaluate how blood flows through your heart.  Transesophageal echocardiogram (TEE).  Cardiac monitoring. This allows your health care provider to monitor your heart rate and rhythm in real time.  Holter monitor. This is a portable device that records your heartbeat and can help diagnose heart arrhythmias. It allows your health care provider to track your heart activity for several days, if needed.  Stress tests by exercise or by giving medicine that makes the heart beat  faster. TREATMENT   Treatment depends on what may be causing your chest pain. Treatment may include:  Acid blockers for heartburn.  Anti-inflammatory medicine.  Pain medicine for inflammatory conditions.  Antibiotics if an infection is present.  You may be advised to change lifestyle habits. This includes stopping smoking and avoiding alcohol, caffeine, and chocolate.  You may be advised to keep your head raised (elevated) when sleeping. This reduces the chance of acid going backward from your stomach into your esophagus. Most of the time, nonspecific chest pain will improve within 2-3 days with rest and mild pain medicine.  HOME CARE INSTRUCTIONS   If antibiotics were prescribed, take them as directed. Finish them even if you start to feel better.  For the next few days, avoid physical activities that bring on chest pain. Continue physical activities as directed.  Do not use any tobacco products, including cigarettes, chewing tobacco, or electronic cigarettes.  Avoid drinking alcohol.  Only take medicine as directed by your health care provider.  Follow your health care provider's suggestions for further testing if your chest pain does not go away.  Keep any follow-up appointments you made. If you do not go to an appointment, you could develop lasting (chronic) problems with pain. If there is any problem keeping an appointment, call to reschedule. SEEK MEDICAL CARE IF:   Your chest pain does not go away, even after treatment.  You have a rash with blisters on your chest.  You have a fever. SEEK IMMEDIATE MEDICAL CARE IF:   You have increased chest pain or pain that spreads to your  arm, neck, jaw, back, or abdomen.  You have shortness of breath.  You have an increasing cough, or you cough up blood.  You have severe back or abdominal pain.  You feel nauseous or vomit.  You have severe weakness.  You faint.  You have chills. This is an emergency. Do not wait to  see if the pain will go away. Get medical help at once. Call your local emergency services (911 in [place] Do not drive yourself to the hospital. MAKE SURE YOU:   Understand these instructions.  Will watch your condition.  Will get help right away if you are not doing well or get worse. Document Released: 09/09/2005 Document Revised: 12/05/2013 Document Reviewed: 07/05/2008 [facility] Patient Information 2015 [place], [place]. This information is not intended to replace advice given to you by your health care provider. Make sure you discuss any questions you have with your health care provider.

## 2014-08-26 NOTE — ED Provider Notes (Signed)
  Face-to-face evaluation   History: He is here for evaluation of 2 episodes of dizziness, one yesterday, and one today. Both were short lived. He started Flomax yesterday The episode today was accompanied by discomfort in the left upper chest, and left shoulder. This also resolved spontaneously. There is no diaphoresis or shortness of breath,  Physical exam: Alert, calm, cooperative. He is asymptomatic at this time. Repeat blood pressure, on the right arm, 146/80  Assessment- this is associated with use of Flomax. Doubt ACS, PE, or pneumonia. I suspect mild hypotension, that is transient. He, stable for discharge with outpatient followup with his PCP  Medical screening examination/treatment/procedure(s) were conducted as a shared visit with non-physician practitioner(s) and myself.  I personally evaluated the patient during the encounter  Flint Melter, MD 08/26/14 2144

## 2014-08-30 ENCOUNTER — Telehealth: Payer: Self-pay

## 2014-08-30 NOTE — Telephone Encounter (Signed)
Pt of Dr. Cleta Alberts was recently admitted to the ER for symptoms similar to a heart attack, they ruled this out at the hospital. Pt has an upcoming appt. With Dr. Cleta Alberts in October, but would like for him to take a look at his notes from the er, and if he sees anything that seemed to be alarming, the pt said he would come in before then if need. If not pt would just like him to be aware of this ER visit before his upcomming PE

## 2014-08-31 NOTE — Telephone Encounter (Signed)
Call patient and ask him to see me Sunday at 104 as a walk in patient.

## 2014-09-01 NOTE — Telephone Encounter (Signed)
LMOM to RTC tomorrow.

## 2014-09-02 ENCOUNTER — Ambulatory Visit (INDEPENDENT_AMBULATORY_CARE_PROVIDER_SITE_OTHER): Payer: Medicare Other | Admitting: Emergency Medicine

## 2014-09-02 VITALS — BP 128/68 | HR 56 | Temp 97.7°F | Resp 20 | Ht 67.5 in | Wt 149.0 lb

## 2014-09-02 DIAGNOSIS — R079 Chest pain, unspecified: Secondary | ICD-10-CM

## 2014-09-02 DIAGNOSIS — R9431 Abnormal electrocardiogram [ECG] [EKG]: Secondary | ICD-10-CM

## 2014-09-02 NOTE — Progress Notes (Addendum)
Subjective:    Patient ID: Thomas Mcbride, male    DOB: January 05, 1946, 68 y.o.   MRN: [id number] This chart was scribed for Viviann Spare A. Cleta Alberts, MD by Chestine Spore, ED Scribe. The patient was seen in room 13 at 2:01 PM.   Chief Complaint  Patient presents with  . Hospitalization Follow-up    HPI Thomas Mcbride is a 68 y.o. male who presents today complaining of a hospitalization F/U. He states that on the Saturday before he went to the ED, was the first problem that he noticed. He states that was when he had started back with Flomax. He states that he was taking it in the morning with his other medications. He states that he has a fluctuation in his Blood pressure. He states that morning, he was nauseated while scrapping tile. He states that he began to feel like he was going to pass out. He states that after that his abdomen felt funny, he denies CP. He states that he vomited medications. He states that on the next day, he went to a meeting at [facility] and felt fine. He states that shortly after the meeting, he felt lightheaded again, similar to the prior symptoms without nausea. He states that the symptoms were a 2/10 and not significant but noticeable in his upper chest. He states that he felt the pain radiate in his left arm. He states that the pain was unusual that he was nervous and he went to the ED. He states that he had to drive himself to the ED, because there were no available rides. He states that he stopped at a store and bought [facility] 81 mg ASA and took 3. He states that at the ED he had CXR, EKG, and CBC completed by the PA, Tatyana A Kirichenko. He states that Dr. Effie Shy was brought in and he was informed that he did not have a heart attack. Dr. Effie Shy, stated that a Flow test would not be urgent. He states that he has been taking a baby ASA today and he did not take one today. He denies any other associated symptoms.    Patient Active Problem List   Diagnosis Date Noted  . Depression  06/22/2014  . Other and unspecified hyperlipidemia 05/02/2013   Past Medical History  Diagnosis Date  . Depression   . Attention deficit hyperactivity disorder (ADHD)    Past Surgical History  Procedure Laterality Date  . Vasectomy    . Fracture surgery     No Known Allergies Prior to Admission medications   Medication Sig Start Date End Date Taking? Authorizing Provider  aspirin EC 81 MG tablet Take 81 mg by mouth daily.   Yes Historical Provider, MD  atorvastatin (LIPITOR) 20 MG tablet Take 20 mg by mouth daily.   Yes Historical Provider, MD  Coenzyme Q10 (CO Q-10) 100 MG CAPS Take 1 tablet by mouth daily.    Yes Historical Provider, MD  FLUoxetine (PROZAC) 20 MG capsule Take 20 mg by mouth daily.   Yes Historical Provider, MD  GLUCOSAMINE-CHONDROITIN PO Take 1 tablet by mouth. occasionally when working out (4-6 monthly)   Yes Historical Provider, MD  L-TYROSINE PO Take 1 tablet by mouth daily.   Yes Historical Provider, MD  Multiple Vitamins-Minerals ([employer]S SPECTRAVITE SENIOR PO) Take 1 tablet by mouth daily.   Yes Historical Provider, MD  tamsulosin (FLOMAX) 0.4 MG CAPS capsule Take 1 capsule (0.4 mg total) by mouth daily. 06/22/14  Yes [name], MD  EKG small septal q waves   Review of Systems     Objective:   Physical Exam  Nursing note and vitals reviewed. Constitutional: He is oriented to person, place, and time. He appears well-developed and well-nourished. No distress.  HENT:  Head: Normocephalic and atraumatic.  Eyes: EOM are normal.  Neck: Neck supple.  Cardiovascular: Normal rate.   Pulmonary/Chest: Effort normal. No respiratory distress.  Musculoskeletal: Normal range of motion.  Neurological: He is alert and oriented to person, place, and time.  Skin: Skin is warm and dry.  Psychiatric: He has a normal mood and affect. His behavior is normal.   EKG Small septal q waves      BP 128/68  Pulse 56  Temp(Src) 97.7 F (36.5 C) (Oral)  Resp 20  Ht 5'  7.5" (1.715 m)  Wt 149 lb (67.586 kg)  BMI 22.98 kg/m2  SpO2 98%  Assessment & Plan:  I personally performed the services described in this documentation, which was scribed in my presence. The recorded information has been reviewed and is accurate.   Due to abnormal ECG age and high cholesterol will refer to cardiology for their evaluation. He will continue on a baby ASA and Lipitor.

## 2014-09-03 ENCOUNTER — Telehealth: Payer: Self-pay

## 2014-09-03 NOTE — Telephone Encounter (Signed)
Medical records can not make additions to patient records. Will forward to clinical.

## 2014-09-03 NOTE — Telephone Encounter (Signed)
Note to be added to his chart;  His mother told him he has an irregular sound in his heart when he was a baby.   [phone]

## 2014-09-04 NOTE — Telephone Encounter (Signed)
Noted  

## 2014-10-02 ENCOUNTER — Encounter: Payer: Self-pay | Admitting: Emergency Medicine

## 2014-10-02 ENCOUNTER — Other Ambulatory Visit: Payer: Self-pay | Admitting: Emergency Medicine

## 2014-10-02 ENCOUNTER — Ambulatory Visit (INDEPENDENT_AMBULATORY_CARE_PROVIDER_SITE_OTHER): Payer: Medicare Other | Admitting: Emergency Medicine

## 2014-10-02 VITALS — BP 136/64 | HR 90 | Temp 98.0°F | Resp 16 | Ht 67.0 in | Wt 145.0 lb

## 2014-10-02 DIAGNOSIS — Z23 Encounter for immunization: Secondary | ICD-10-CM

## 2014-10-02 DIAGNOSIS — E785 Hyperlipidemia, unspecified: Secondary | ICD-10-CM

## 2014-10-02 DIAGNOSIS — Z1211 Encounter for screening for malignant neoplasm of colon: Secondary | ICD-10-CM

## 2014-10-02 DIAGNOSIS — N400 Enlarged prostate without lower urinary tract symptoms: Secondary | ICD-10-CM

## 2014-10-02 DIAGNOSIS — F329 Major depressive disorder, single episode, unspecified: Secondary | ICD-10-CM

## 2014-10-02 DIAGNOSIS — F32A Depression, unspecified: Secondary | ICD-10-CM

## 2014-10-02 DIAGNOSIS — Z0000 Encounter for general adult medical examination without abnormal findings: Secondary | ICD-10-CM

## 2014-10-02 LAB — POCT URINALYSIS DIPSTICK
BILIRUBIN UA: NEGATIVE
Blood, UA: NEGATIVE
Glucose, UA: NEGATIVE
Ketones, UA: NEGATIVE
LEUKOCYTES UA: NEGATIVE
NITRITE UA: NEGATIVE
PH UA: 6
Protein, UA: NEGATIVE
Spec Grav, UA: 1.01
Urobilinogen, UA: 0.2

## 2014-10-02 LAB — IFOBT (OCCULT BLOOD): IMMUNOLOGICAL FECAL OCCULT BLOOD TEST: NEGATIVE

## 2014-10-02 MED ORDER — FLUOXETINE HCL 20 MG PO CAPS: 20.0000 mg | ORAL_CAPSULE | Freq: Every day | ORAL | Status: DC

## 2014-10-02 MED ORDER — ZOSTER VACCINE LIVE 19400 UNT/0.65ML ~~LOC~~ SOLR: 0.6500 mL | Freq: Once | SUBCUTANEOUS | Status: DC

## 2014-10-02 MED ORDER — ATORVASTATIN CALCIUM 20 MG PO TABS: 20.0000 mg | ORAL_TABLET | Freq: Every day | ORAL | Status: DC

## 2014-10-02 NOTE — Progress Notes (Addendum)
Subjective:  This chart was scribed for Thomas Chris, MD by [name], Medical Scribe. This patient was seen in Room 21 and the patient's care was started at 2:05 PM.   Patient ID: Thomas Mcbride, male    DOB: 06/30/1946, 68 y.o.   MRN: [id number]  HPI HPI Comments: Thomas Mcbride is a 68 y.o. male who presents to the [facility] for an annual exam.  He was seen here for a follow-up exam on 9/20 after being hospitalized for chest pain on 9/13.  He was referred to [name] for further evaluation and since his last appointment.  He saw [name] and states that he had a pleasant experience with him.  He states that he does not feel depressed anymore and is still taking 20mg  Prozac daily.  He states that he missed one dosage and did not experience any negative symptoms.  He is complaining of sore throat and sinus pressure that started 3.5 weeks ago.  He has taken Allegra D and Mucinex with mild relief to his symptoms.  He states that he experiences postnasal drip when he lays down at night.  He does not feel bad during the day.     Patient Active Problem List   Diagnosis Date Noted  . Nonspecific abnormal electrocardiogram (ECG) (EKG) 09/02/2014  . Depression 06/22/2014  . Other and unspecified hyperlipidemia 05/02/2013   Past Medical History  Diagnosis Date  . Depression   . Attention deficit hyperactivity disorder (ADHD)    Past Surgical History  Procedure Laterality Date  . Vasectomy    . Fracture surgery     No Known Allergies Prior to Admission medications   Medication Sig Start Date End Date Taking? Authorizing Provider  aspirin EC 81 MG tablet Take 81 mg by mouth daily.   Yes Historical Provider, MD  atorvastatin (LIPITOR) 20 MG tablet Take 20 mg by mouth daily.   Yes Historical Provider, MD  Coenzyme Q10 (CO Q-10) 100 MG CAPS Take 1 tablet by mouth daily.    Yes Historical Provider, MD  FLUoxetine (PROZAC) 20 MG capsule Take 20 mg by mouth daily.   Yes  Historical Provider, MD  GLUCOSAMINE-CHONDROITIN PO Take 1 tablet by mouth. occasionally when working out (4-6 monthly)   Yes Historical Provider, MD  L-TYROSINE PO Take 1 tablet by mouth daily.   Yes Historical Provider, MD  Multiple Vitamins-Minerals ([employer] SPECTRAVITE SENIOR PO) Take 1 tablet by mouth daily.   Yes Historical Provider, MD  tamsulosin (FLOMAX) 0.4 MG CAPS capsule Take 1 capsule (0.4 mg total) by mouth daily. 06/22/14  Yes [name], MD   History   Social History  . Marital Status: Divorced    Spouse Name: N/A    Number of Children: N/A  . Years of Education: N/A   Occupational History  . 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 Smoking status: Never Smoker   . Smokeless tobacco: Never Used  . Alcohol Use: No  . Drug Use: No  . Sexual Activity: No   Other Topics Concern  . Not on file   Social History Narrative   Exercises 3 x's week on eliptical machine.          Review of Systems  HENT: Positive for postnasal drip, sinus pressure and sore throat.     Objective:  Physical Exam  Nursing note and vitals reviewed. Constitutional: He is oriented to person, place, and time. He appears well-developed  and well-nourished.  HENT:  Head: Normocephalic and atraumatic.  Right Ear: Tympanic membrane, external ear and ear canal normal.  Left Ear: Tympanic membrane, external ear and ear canal normal.  Mouth/Throat: Oropharynx is clear and moist. No oropharyngeal exudate.  Eyes: Conjunctivae and EOM are normal. Pupils are equal, round, and reactive to light. No scleral icterus.  Neck: Normal range of motion. Neck supple. No thyromegaly present.  Cardiovascular: Normal rate, regular rhythm and normal heart sounds.   No murmur heard. Pulmonary/Chest: Effort normal and breath sounds normal. No respiratory distress. He has no wheezes. He has no rales.  Abdominal: Soft. There is no tenderness.  Musculoskeletal: Normal range of motion.    Lymphadenopathy:    He has no cervical adenopathy.  Neurological: He is alert and oriented to person, place, and time.  Skin: Skin is warm and dry. No rash noted.  Severe bilateral verucose veins.    Psychiatric: He has a normal mood and affect. His behavior is normal.   Meds ordered this encounter  Medications  . FLUoxetine (PROZAC) 20 MG capsule    Sig: Take 1 capsule (20 mg total) by mouth daily.    Dispense:  30 capsule    Refill:  11  . atorvastatin (LIPITOR) 20 MG tablet    Sig: Take 1 tablet (20 mg total) by mouth daily.    Dispense:  30 tablet    Refill:  11   Results for orders placed during the hospital encounter of 08/26/14  CBC      Result Value Ref Range   WBC 6.1  4.0 - 10.5 K/uL   RBC 5.02  4.22 - 5.81 MIL/uL   Hemoglobin 14.2  13.0 - 17.0 g/dL   HCT 16.142.6  09.639.0 - 04.552.0 %   MCV 84.9  78.0 - 100.0 fL   MCH 28.3  26.0 - 34.0 pg   MCHC 33.3  30.0 - 36.0 g/dL   RDW 40.913.7  81.111.5 - 91.415.5 %   Platelets 215  150 - 400 K/uL  BASIC METABOLIC PANEL      Result Value Ref Range   Sodium 138  137 - 147 mEq/L   Potassium 4.1  3.7 - 5.3 mEq/L   Chloride 99  96 - 112 mEq/L   CO2 24  19 - 32 mEq/L   Glucose, Bld 144 (*) 70 - 99 mg/dL   BUN 20  6 - 23 mg/dL   Creatinine, Ser 7.820.95  0.50 - 1.35 mg/dL   Calcium 9.1  8.4 - 95.610.5 mg/dL   GFR calc non Af Amer 85 (*) >90 mL/min   GFR calc Af Amer >90  >90 mL/min   Anion gap 15  5 - 15  I-STAT TROPOININ, ED      Result Value Ref Range   Troponin i, poc 0.00  0.00 - 0.08 ng/mL   Comment 3            Results for orders placed during the hospital encounter of 08/26/14  CBC      Result Value Ref Range   WBC 6.1  4.0 - 10.5 K/uL   RBC 5.02  4.22 - 5.81 MIL/uL   Hemoglobin 14.2  13.0 - 17.0 g/dL   HCT 21.342.6  08.639.0 - 57.852.0 %   MCV 84.9  78.0 - 100.0 fL   MCH 28.3  26.0 - 34.0 pg   MCHC 33.3  30.0 - 36.0 g/dL   RDW 46.913.7  62.911.5 - 52.815.5 %   Platelets  215  150 - 400 K/uL  BASIC METABOLIC PANEL      Result Value Ref Range   Sodium 138   137 - 147 mEq/L   Potassium 4.1  3.7 - 5.3 mEq/L   Chloride 99  96 - 112 mEq/L   CO2 24  19 - 32 mEq/L   Glucose, Bld 144 (*) 70 - 99 mg/dL   BUN 20  6 - 23 mg/dL   Creatinine, Ser 1.610.95  0.50 - 1.35 mg/dL   Calcium 9.1  8.4 - 09.610.5 mg/dL   GFR calc non Af Amer 85 (*) >90 mL/min   GFR calc Af Amer >90  >90 mL/min   Anion gap 15  5 - 15  I-STAT TROPOININ, ED      Result Value Ref Range   Troponin i, poc 0.00  0.00 - 0.08 ng/mL   Comment 3            Results for orders placed during the hospital encounter of 08/26/14  CBC      Result Value Ref Range   WBC 6.1  4.0 - 10.5 K/uL   RBC 5.02  4.22 - 5.81 MIL/uL   Hemoglobin 14.2  13.0 - 17.0 g/dL   HCT 04.542.6  40.939.0 - 81.152.0 %   MCV 84.9  78.0 - 100.0 fL   MCH 28.3  26.0 - 34.0 pg   MCHC 33.3  30.0 - 36.0 g/dL   RDW 91.413.7  78.211.5 - 95.615.5 %   Platelets 215  150 - 400 K/uL  BASIC METABOLIC PANEL      Result Value Ref Range   Sodium 138  137 - 147 mEq/L   Potassium 4.1  3.7 - 5.3 mEq/L   Chloride 99  96 - 112 mEq/L   CO2 24  19 - 32 mEq/L   Glucose, Bld 144 (*) 70 - 99 mg/dL   BUN 20  6 - 23 mg/dL   Creatinine, Ser 2.130.95  0.50 - 1.35 mg/dL   Calcium 9.1  8.4 - 08.610.5 mg/dL   GFR calc non Af Amer 85 (*) >90 mL/min   GFR calc Af Amer >90  >90 mL/min   Anion gap 15  5 - 15  I-STAT TROPOININ, ED      Result Value Ref Range   Troponin i, poc 0.00  0.00 - 0.08 ng/mL   Comment 3            BP 136/64  Pulse 90  Temp(Src) 98 F (36.7 C) (Oral)  Resp 16  Ht 5\' 7"  (1.702 m)  Wt 145 lb (65.772 kg)  BMI 22.71 kg/m2  SpO2 97% Assessment & Plan:  He will be given Prevnar vaccine today and will receive his flu shot at work.  He will be completing his cardiac workup.  Overall he is doing well and in good spirits and his depression is doing well on his Prozac. His Lipitor and Prozac were refilled today. I personally performed the services described in this documentation, which was scribed in my presence. The recorded information has been reviewed and  is accurate.

## 2014-10-03 LAB — CBC WITH DIFFERENTIAL/PLATELET
Basophils Absolute: 0 10*3/uL (ref 0.0–0.1)
Basophils Relative: 0 % (ref 0–1)
EOS PCT: 6 % — AB (ref 0–5)
Eosinophils Absolute: 0.4 10*3/uL (ref 0.0–0.7)
HEMATOCRIT: 43 % (ref 39.0–52.0)
Hemoglobin: 14.2 g/dL (ref 13.0–17.0)
LYMPHS ABS: 1.5 10*3/uL (ref 0.7–4.0)
LYMPHS PCT: 22 % (ref 12–46)
MCH: 28.1 pg (ref 26.0–34.0)
MCHC: 33 g/dL (ref 30.0–36.0)
MCV: 85 fL (ref 78.0–100.0)
MONO ABS: 0.7 10*3/uL (ref 0.1–1.0)
Monocytes Relative: 10 % (ref 3–12)
Neutro Abs: 4.2 10*3/uL (ref 1.7–7.7)
Neutrophils Relative %: 62 % (ref 43–77)
Platelets: 252 10*3/uL (ref 150–400)
RBC: 5.06 MIL/uL (ref 4.22–5.81)
RDW: 14.5 % (ref 11.5–15.5)
WBC: 6.8 10*3/uL (ref 4.0–10.5)

## 2014-10-03 LAB — COMPLETE METABOLIC PANEL WITH GFR
ALT: 17 U/L (ref 0–53)
AST: 19 U/L (ref 0–37)
Albumin: 4.1 g/dL (ref 3.5–5.2)
Alkaline Phosphatase: 50 U/L (ref 39–117)
BUN: 19 mg/dL (ref 6–23)
CALCIUM: 9.2 mg/dL (ref 8.4–10.5)
CO2: 30 meq/L (ref 19–32)
CREATININE: 0.89 mg/dL (ref 0.50–1.35)
Chloride: 102 mEq/L (ref 96–112)
GFR, Est African American: >89 mL/min
GFR, Est Non African American: 89 mL/min
Glucose, Bld: 87 mg/dL (ref 70–99)
Potassium: 4.2 mEq/L (ref 3.5–5.3)
Sodium: 139 mEq/L (ref 135–145)
Total Bilirubin: 0.7 mg/dL (ref 0.2–1.2)
Total Protein: 6.1 g/dL (ref 6.0–8.3)

## 2014-10-03 LAB — LIPID PANEL
CHOL/HDL RATIO: 2.3 ratio
Cholesterol: 164 mg/dL (ref 0–200)
HDL: 71 mg/dL (ref >39)
LDL Cholesterol: 78 mg/dL (ref 0–99)
Triglycerides: 74 mg/dL (ref <150)
VLDL: 15 mg/dL (ref 0–40)

## 2014-10-03 LAB — TSH: TSH: 4.697 u[IU]/mL — AB (ref 0.350–4.500)

## 2014-10-03 LAB — PSA, MEDICARE: PSA: 2.93 ng/mL (ref <=4.00)

## 2014-10-03 NOTE — Addendum Note (Signed)
Addended by: Thelma BargeICHARDSON, Narayan Scull D on: 10/03/2014 02:20 PM   Modules accepted: Orders

## 2014-10-03 NOTE — Addendum Note (Signed)
Addended by: Thelma BargeICHARDSON, SHEKETIA D on: 10/03/2014 02:21 PM   Modules accepted: Orders

## 2014-10-03 NOTE — Addendum Note (Signed)
Addended by: Thelma BargeICHARDSON, Ameliarose Shark D on: 10/03/2014 02:19 PM   Modules accepted: Orders

## 2014-10-04 LAB — T4, FREE: Free T4: 0.98 ng/dL (ref 0.80–1.80)

## 2014-10-16 ENCOUNTER — Encounter: Payer: Self-pay | Admitting: Emergency Medicine

## 2014-10-31 ENCOUNTER — Encounter: Payer: Self-pay | Admitting: Emergency Medicine

## 2014-11-27 ENCOUNTER — Telehealth: Payer: Self-pay | Admitting: Family Medicine

## 2014-11-27 NOTE — Telephone Encounter (Signed)
Left a message for patient to return call to update flu shot information or come by and get the flu shot.

## 2015-02-13 ENCOUNTER — Telehealth: Payer: Self-pay

## 2015-02-13 NOTE — Telephone Encounter (Signed)
Dr. Cleta Albertsaub do you remember which psychologist? You did not mention this in your note.

## 2015-02-13 NOTE — Telephone Encounter (Signed)
Dr Cleta Albertsaub gave patient the name of a psychologist, patient does not remember the name and is wanting to go to him now.   [phone]

## 2015-02-14 NOTE — Telephone Encounter (Signed)
Spoke with pt, I gave him psychologist name and number.

## 2015-02-14 NOTE — Telephone Encounter (Signed)
I would like him to [facility]

## 2015-04-02 ENCOUNTER — Ambulatory Visit (INDEPENDENT_AMBULATORY_CARE_PROVIDER_SITE_OTHER): Payer: Medicare Other | Admitting: Emergency Medicine

## 2015-04-02 ENCOUNTER — Encounter: Payer: Self-pay | Admitting: Emergency Medicine

## 2015-04-02 VITALS — BP 110/60 | HR 50 | Temp 97.3°F | Resp 18 | Ht 67.25 in | Wt 150.8 lb

## 2015-04-02 DIAGNOSIS — F32A Depression, unspecified: Secondary | ICD-10-CM

## 2015-04-02 DIAGNOSIS — R946 Abnormal results of thyroid function studies: Secondary | ICD-10-CM

## 2015-04-02 DIAGNOSIS — R972 Elevated prostate specific antigen [PSA]: Secondary | ICD-10-CM | POA: Diagnosis not present

## 2015-04-02 DIAGNOSIS — F329 Major depressive disorder, single episode, unspecified: Secondary | ICD-10-CM

## 2015-04-02 DIAGNOSIS — R7989 Other specified abnormal findings of blood chemistry: Secondary | ICD-10-CM

## 2015-04-02 DIAGNOSIS — N400 Enlarged prostate without lower urinary tract symptoms: Secondary | ICD-10-CM | POA: Diagnosis not present

## 2015-04-02 LAB — TSH: TSH: 4.402 u[IU]/mL (ref 0.350–4.500)

## 2015-04-02 LAB — T4, FREE: Free T4: 1.05 ng/dL (ref 0.80–1.80)

## 2015-04-02 NOTE — Progress Notes (Signed)
Subjective:  This chart was scribed for Thomas GobbleSteven A Treshun Wold, MD by Veverly FellsHatice Demirci,scribe, at [facility].  This patient was seen in room 22 and the patient's care was started at 10:26 AM.    Patient ID: Thomas Mcbride, male    DOB: 04/24/1946, 69 y.o.   MRN: [id number]  HPI  HPI Comments: Thomas Mcbride is a 69 y.o. male who presents to the [facility] for a follow up for his blood work (PSA).His PSA went from 2.02 to 2.93 and he was advised to return to clinic today for a PSA. TSH was also slightly elevated.   He is compliant with his Lipitor and Flomax.      Depression: Patient notes that he feels down at times and doesn't feel motivated. On a scale of 0-10, he feels 50%-60% depressed. He denies any changes in eating and works out (elliptical and free weights).  He is currently taking Prozac 20 mg.  He is not planning on taking any trips for the summer and states that he does do better with depression during this time.       Patient Active Problem List   Diagnosis Date Noted  . Nonspecific abnormal electrocardiogram (ECG) (EKG) 09/02/2014  . Depression 06/22/2014  . Other and unspecified hyperlipidemia 05/02/2013   Past Medical History  Diagnosis Date  . Depression   . Attention deficit hyperactivity disorder (ADHD)   . Allergy    Past Surgical History  Procedure Laterality Date  . Vasectomy    . Fracture surgery  1972    leg   No Known Allergies Prior to Admission medications   Medication Sig Start Date End Date Taking? Authorizing Provider  aspirin EC 81 MG tablet Take 81 mg by mouth daily.    Historical Provider, MD  atorvastatin (LIPITOR) 20 MG tablet Take 1 tablet (20 mg total) by mouth daily. 10/02/14   Thomas GobbleSteven A Khyla Mccumbers, MD  Coenzyme Q10 (CO Q-10) 100 MG CAPS Take 1 tablet by mouth daily.     Historical Provider, MD  FLUoxetine (PROZAC) 20 MG capsule Take 1 capsule (20 mg total) by mouth daily. 10/02/14   Thomas GobbleSteven A Cheril Slattery, MD    GLUCOSAMINE-CHONDROITIN PO Take 1 tablet by mouth. occasionally when working out (4-6 monthly)    Historical Provider, MD  L-TYROSINE PO Take 1 tablet by mouth daily.    Historical Provider, MD  Multiple Vitamins-Minerals ([employer]S SPECTRAVITE SENIOR PO) Take 1 tablet by mouth daily.    Historical Provider, MD  tamsulosin (FLOMAX) 0.4 MG CAPS capsule Take 1 capsule (0.4 mg total) by mouth daily. 06/22/14   Thomas GobbleSteven A Ranee Peasley, MD  zoster vaccine live, PF, (ZOSTAVAX) [id number] UNT/0.65ML injection Inject 19,400 Units into the skin once. 10/02/14   Thomas GobbleSteven A Sandia Pfund, MD   History   Social History  . Marital Status: Divorced    Spouse Name: N/A  . Number of Children: N/A  . Years of Education: N/A   Occupational History  . 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 Smoking status: Never Smoker   . Smokeless tobacco: Never Used  . Alcohol Use: No  . Drug Use: No  . Sexual Activity: No   Other Topics Concern  . Not on file   Social History Narrative   Exercises 3 x's week on eliptical machine.           Review of Systems  Constitutional: Negative for fever and chills.  HENT: Negative  for congestion.   Respiratory: Negative for cough, choking and shortness of breath.        Objective:   Physical Exam  CONSTITUTIONAL:He is alert and cooperative. HEAD: Normocephalic/atraumatic EYES: EOMI/PERRL ENMT: Mucous membranes moist NECK: supple no meningeal signs SPINE/BACK:entire spine nontender CV: S1/S2 noted, no murmurs/rubs/gallops noted LUNGS: Lungs are clear to auscultation bilaterally, no apparent distress ABDOMEN: soft, nontender, no rebound or guarding, bowel sounds noted throughout abdomen GU:no cva tenderness NEURO: Pt is awake/alert/appropriate, moves all extremitiesx4.  No facial droop.   EXTREMITIES: pulses normal/equal, full ROM SKIN: warm, color normal PSYCH: no abnormalities of mood noted, alert and oriented to situation      BP 110/60 mmHg  Pulse 50   Temp(Src) 97.3 F (36.3 C) (Oral)  Resp 18  Ht 5' 7.25" (1.708 m)  Wt 150 lb 12.8 oz (68.402 kg)  BMI 23.45 kg/m2  SpO2 97%       Assessment & Plan:  Follow-up PSA TSH and T4 were done. His PSA had been rising. He does have a history of BPH. TSH was slightly elevated with a normal T4 this was also repeated today.I personally performed the services described in this documentation, which was scribed in my presence. The recorded information has been reviewed and is accurate.

## 2015-04-03 LAB — PSA: PSA: 2.4 ng/mL (ref <=4.00)

## 2015-05-16 ENCOUNTER — Ambulatory Visit (INDEPENDENT_AMBULATORY_CARE_PROVIDER_SITE_OTHER): Payer: Medicare Other | Admitting: Family Medicine

## 2015-05-16 VITALS — BP 112/60 | HR 51 | Temp 97.6°F | Resp 18 | Ht 69.0 in | Wt 155.0 lb

## 2015-05-16 DIAGNOSIS — M545 Low back pain, unspecified: Secondary | ICD-10-CM

## 2015-05-16 NOTE — Patient Instructions (Addendum)
You likely have a sprained ligament or strained muscle in the low back, which can lead to some muscle spasm as well. Try tylenol and if this does not help - advil or alleve over the counter(use lowest effective dose and take only if needed).  Heat or ice to area as needed and the other treatments and exercises in the back care manual as tolerated. If not improving in next 2 weeks - return for recheck and possible xray. Return to the clinic or go to the nearest emergency room if any of your symptoms worsen or new symptoms occur.   Back Pain, Adult Low back pain is very common. About 1 in 5 people have back pain.The cause of low back pain is rarely dangerous. The pain often gets better over time.About half of people with a sudden onset of back pain feel better in just 2 weeks. About 8 in 10 people feel better by 6 weeks.  CAUSES Some common causes of back pain include:  Strain of the muscles or ligaments supporting the spine.  Wear and tear (degeneration) of the spinal discs.  Arthritis.  Direct injury to the back. DIAGNOSIS Most of the time, the direct cause of low back pain is not known.However, back pain can be treated effectively even when the exact cause of the pain is unknown.Answering your caregiver's questions about your overall health and symptoms is one of the most accurate ways to make sure the cause of your pain is not dangerous. If your caregiver needs more information, he or she may order lab work or imaging tests (X-rays or MRIs).However, even if imaging tests show changes in your back, this usually does not require surgery. HOME CARE INSTRUCTIONS For many people, back pain returns.Since low back pain is rarely dangerous, it is often a condition that people can learn to [facility]manageon their own.   Remain active. It is stressful on the back to sit or stand in one place. Do not sit, drive, or stand in one place for more than 30 minutes at a time. Take short walks on level surfaces as  soon as pain allows.Try to increase the length of time you walk each day.  Do not stay in bed.Resting more than 1 or 2 days can delay your recovery.  Do not avoid exercise or work.Your body is made to move.It is not dangerous to be active, even though your back may hurt.Your back will likely heal faster if you return to being active before your pain is gone.  Pay attention to your body when you bend and lift. Many people have less discomfortwhen lifting if they bend their knees, keep the load close to their bodies,and avoid twisting. Often, the most comfortable positions are those that put less stress on your recovering back.  Find a comfortable position to sleep. Use a firm mattress and lie on your side with your knees slightly bent. If you lie on your back, put a pillow under your knees.  Only take over-the-counter or prescription medicines as directed by your caregiver. Over-the-counter medicines to reduce pain and inflammation are often the most helpful.Your caregiver may prescribe muscle relaxant drugs.These medicines help dull your pain so you can more quickly return to your normal activities and healthy exercise.  Put ice on the injured area.  Put ice in a plastic bag.  Place a towel between your skin and the bag.  Leave the ice on for 15-20 minutes, 03-04 times a day for the first 2 to 3 days. After  that, ice and heat may be alternated to reduce pain and spasms.  Ask your caregiver about trying back exercises and gentle massage. This may be of some benefit.  Avoid feeling anxious or stressed.Stress increases muscle tension and can worsen back pain.It is important to recognize when you are anxious or stressed and learn ways to manage it.Exercise is a great option. SEEK MEDICAL CARE IF:  You have pain that is not relieved with rest or medicine.  You have pain that does not improve in 1 week.  You have new symptoms.  You are generally not feeling well. SEEK IMMEDIATE  MEDICAL CARE IF:   You have pain that radiates from your back into your legs.  You develop new bowel or bladder control problems.  You have unusual weakness or numbness in your arms or legs.  You develop nausea or vomiting.  You develop abdominal pain.  You feel faint. Document Released: 11/30/2005 Document Revised: 05/31/2012 Document Reviewed: 04/03/2014 [facility] Patient Information 2015 [place], [place]. This information is not intended to replace advice given to you by your health care provider. Make sure you discuss any questions you have with your health care provider.

## 2015-05-16 NOTE — Progress Notes (Signed)
Subjective:  This chart was scribed for Thomas StaggersJeffrey Miko Markwood, MD by Thomas Mcbride, Medical Scribe. This patient was seen in room 10 and the patient's care was started 12:34 PM.    Patient ID: Thomas Mcbride, male    DOB: 12/23/1945, 69 y.o.   MRN: [id number]  HPI Thomas Mcbride is a 69 y.o. male  He is here for back pain today. He woke up in the morning 5 days ago and felt tightness in his lower back. He describes soreness with bending and standing up and sitting down. It loosens up a little over time. He has not taken any medication and hasn't used any methods for treatment. He denies history of injury or falling. He denies new difficulty of urinating, no urinary or stool incontinences, no blood in urine or stool and no leg radiation. He has history of prostate swelling. His father had heart issues.   Recently PSA of 2.4 that was lower than 7 months prior at 2.9.    Patient Active Problem List   Diagnosis Date Noted  . Nonspecific abnormal electrocardiogram (ECG) (EKG) 09/02/2014  . Depression 06/22/2014  . Other and unspecified hyperlipidemia 05/02/2013   Past Medical History  Diagnosis Date  . Depression   . Attention deficit hyperactivity disorder (ADHD)   . Allergy    Past Surgical History  Procedure Laterality Date  . Vasectomy    . Fracture surgery  1972    leg   No Known Allergies Prior to Admission medications   Medication Sig Start Date End Date Taking? Authorizing Provider  aspirin EC 81 MG tablet Take 81 mg by mouth daily.   Yes Historical Provider, MD  atorvastatin (LIPITOR) 20 MG tablet Take 1 tablet (20 mg total) by mouth daily. 10/02/14  Yes Collene GobbleSteven A Daub, MD  Coenzyme Q10 (CO Q-10) 100 MG CAPS Take 1 tablet by mouth daily.    Yes Historical Provider, MD  FLUoxetine (PROZAC) 20 MG capsule Take 1 capsule (20 mg total) by mouth daily. 10/02/14  Yes Collene GobbleSteven A Daub, MD  GLUCOSAMINE-CHONDROITIN PO Take 1 tablet by mouth. occasionally when working out (4-6 monthly)   Yes  Historical Provider, MD  L-TYROSINE PO Take 1 tablet by mouth daily.   Yes Historical Provider, MD  Multiple Vitamins-Minerals ([employer]S SPECTRAVITE SENIOR PO) Take 1 tablet by mouth daily.   Yes Historical Provider, MD  tamsulosin (FLOMAX) 0.4 MG CAPS capsule Take 1 capsule (0.4 mg total) by mouth daily. 06/22/14  Yes Collene GobbleSteven A Daub, MD  zoster vaccine live, PF, (ZOSTAVAX) [id number] UNT/0.65ML injection Inject 19,400 Units into the skin once. 10/02/14  Yes Collene GobbleSteven A Daub, MD   History   Social History  . Marital Status: Divorced    Spouse Name: N/A  . Number of Children: N/A  . Years of Education: N/A   Occupational History  . 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 Smoking status: Never Smoker   . Smokeless tobacco: Never Used  . Alcohol Use: No  . Drug Use: No  . Sexual Activity: No   Other Topics Concern  . Not on file   Social History Narrative   Exercises 3 x's week on eliptical machine.              Review of Systems  Gastrointestinal: Negative for constipation and blood in stool.  Genitourinary: Negative for dysuria, frequency, hematuria and difficulty urinating.  Musculoskeletal: Positive for back pain.       Objective:   Physical  Exam  Constitutional: He is oriented to person, place, and time. He appears well-developed and well-nourished. No distress.  HENT:  Head: Normocephalic and atraumatic.  Eyes: EOM are normal. Pupils are equal, round, and reactive to light.  Neck: Neck supple.  Cardiovascular: Normal rate.   No murmur heard. Pulmonary/Chest: Effort normal and breath sounds normal. No respiratory distress. He has no wheezes. He has no rhonchi. He has no rales.  Musculoskeletal: Normal range of motion.  Back: 90* flexion but slow, slow extension but intact, lateral flexion was intact, able to heel and toe walk   lower ext: patella and achilles 2+ bilaterally, babinski bilaterally, negative straight leg raise bilaterally    Neurological: He is alert and oriented to person, place, and time.  Skin: Skin is warm and dry.  Lower back: no abrasion with vesicles, slight tenderness on L4 L5, no paraspinal spasm, no sciatica  Psychiatric: He has a normal mood and affect. His behavior is normal.  Nursing note and vitals reviewed.   Filed Vitals:   05/16/15 1042  BP: 112/60  Pulse: 51  Temp: 97.6 F (36.4 C)  TempSrc: Oral  Resp: 18  Height:  (1.753 m)  Weight: 155 lb (70.308 kg)  SpO2: 98%         Assessment & Plan:  Thomas Mcbride is a 69 y.o. male Midline low back pain without sciatica Possible DDD, low back strain or overuse.   -trial of otc tylenol , or rare advil or alleve if needed.   -XR discussed, but with NKI and reassuring exam - deferred for few weeks if not improving. rtc precautions.   No orders of the defined types were placed in this encounter.   Patient Instructions  You likely have a sprained ligament or strained muscle in the low back, which can lead to some muscle spasm as well. Try tylenol and if this does not help - advil or alleve over the counter(use lowest effective dose and take only if needed).  Heat or ice to area as needed and the other treatments and exercises in the back care manual as tolerated. If not improving in next 2 weeks - return for recheck and possible xray. Return to the clinic or go to the nearest emergency room if any of your symptoms worsen or new symptoms occur.   Back Pain, Adult Low back pain is very common. About 1 in 5 people have back pain.The cause of low back pain is rarely dangerous. The pain often gets better over time.About half of people with a sudden onset of back pain feel better in just 2 weeks. About 8 in 10 people feel better by 6 weeks.  CAUSES Some common causes of back pain include:  Strain of the muscles or ligaments supporting the spine.  Wear and tear (degeneration) of the spinal discs.  Arthritis.  Direct injury to the  back. DIAGNOSIS Most of the time, the direct cause of low back pain is not known.However, back pain can be treated effectively even when the exact cause of the pain is unknown.Answering your caregiver's questions about your overall health and symptoms is one of the most accurate ways to make sure the cause of your pain is not dangerous. If your caregiver needs more information, he or she may order lab work or imaging tests (X-rays or MRIs).However, even if imaging tests show changes in your back, this usually does not require surgery. HOME CARE INSTRUCTIONS For many people, back pain returns.Since low back pain is  rarely dangerous, it is often a condition that people can learn to [facility] their own.   Remain active. It is stressful on the back to sit or stand in one place. Do not sit, drive, or stand in one place for more than 30 minutes at a time. Take short walks on level surfaces as soon as pain allows.Try to increase the length of time you walk each day.  Do not stay in bed.Resting more than 1 or 2 days can delay your recovery.  Do not avoid exercise or work.Your body is made to move.It is not dangerous to be active, even though your back may hurt.Your back will likely heal faster if you return to being active before your pain is gone.  Pay attention to your body when you bend and lift. Many people have less discomfortwhen lifting if they bend their knees, keep the load close to their bodies,and avoid twisting. Often, the most comfortable positions are those that put less stress on your recovering back.  Find a comfortable position to sleep. Use a firm mattress and lie on your side with your knees slightly bent. If you lie on your back, put a pillow under your knees.  Only take over-the-counter or prescription medicines as directed by your caregiver. Over-the-counter medicines to reduce pain and inflammation are often the most helpful.Your caregiver may prescribe muscle relaxant  drugs.These medicines help dull your pain so you can more quickly return to your normal activities and healthy exercise.  Put ice on the injured area.  Put ice in a plastic bag.  Place a towel between your skin and the bag.  Leave the ice on for 15-20 minutes, 03-04 times a day for the first 2 to 3 days. After that, ice and heat may be alternated to reduce pain and spasms.  Ask your caregiver about trying back exercises and gentle massage. This may be of some benefit.  Avoid feeling anxious or stressed.Stress increases muscle tension and can worsen back pain.It is important to recognize when you are anxious or stressed and learn ways to manage it.Exercise is a great option. SEEK MEDICAL CARE IF:  You have pain that is not relieved with rest or medicine.  You have pain that does not improve in 1 week.  You have new symptoms.  You are generally not feeling well. SEEK IMMEDIATE MEDICAL CARE IF:   You have pain that radiates from your back into your legs.  You develop new bowel or bladder control problems.  You have unusual weakness or numbness in your arms or legs.  You develop nausea or vomiting.  You develop abdominal pain.  You feel faint. Document Released: 11/30/2005 Document Revised: 05/31/2012 Document Reviewed: 04/03/2014 [facility] Patient Information 2015 [place], [place]. This information is not intended to replace advice given to you by your health care provider. Make sure you discuss any questions you have with your health care provider.     I personally performed the services described in this documentation, which was scribed in my presence. The recorded information has been reviewed and considered, and addended by me as needed.

## 2015-07-11 ENCOUNTER — Other Ambulatory Visit: Payer: Self-pay | Admitting: Emergency Medicine

## 2015-07-14 ENCOUNTER — Telehealth: Payer: Self-pay | Admitting: Family Medicine

## 2015-07-14 NOTE — Telephone Encounter (Signed)
lmom to call and reschedule appt for a CPE with Daub

## 2015-07-19 ENCOUNTER — Other Ambulatory Visit: Payer: Self-pay | Admitting: Emergency Medicine

## 2015-07-19 ENCOUNTER — Telehealth: Payer: Self-pay | Admitting: Family Medicine

## 2015-07-19 NOTE — Telephone Encounter (Signed)
Let patient know his appt with Daub for a CPE is 12/05/15 at 10:15

## 2015-07-23 ENCOUNTER — Telehealth: Payer: Self-pay | Admitting: Emergency Medicine

## 2015-07-23 NOTE — Telephone Encounter (Signed)
Patient needs a refill on Tamsulosin for his enlarged prostate. If he can get it soon that would be wonderful. [facility].  [phone]

## 2015-07-24 MED ORDER — TAMSULOSIN HCL 0.4 MG PO CAPS: 0.4000 mg | ORAL_CAPSULE | Freq: Every day | ORAL | Status: DC

## 2015-07-24 NOTE — Telephone Encounter (Signed)
Rx sent, pt notified on voicemail.

## 2015-09-09 ENCOUNTER — Encounter (HOSPITAL_COMMUNITY): Payer: Self-pay | Admitting: *Deleted

## 2015-09-17 ENCOUNTER — Encounter: Payer: Self-pay | Admitting: Emergency Medicine

## 2015-10-03 ENCOUNTER — Encounter: Payer: Self-pay | Admitting: Emergency Medicine

## 2015-12-05 ENCOUNTER — Encounter: Payer: Self-pay | Admitting: Emergency Medicine

## 2015-12-05 ENCOUNTER — Ambulatory Visit (INDEPENDENT_AMBULATORY_CARE_PROVIDER_SITE_OTHER): Payer: Medicare Other | Admitting: Emergency Medicine

## 2015-12-05 VITALS — BP 126/80 | HR 48 | Temp 97.6°F | Resp 16 | Ht 68.0 in | Wt 147.0 lb

## 2015-12-05 DIAGNOSIS — F329 Major depressive disorder, single episode, unspecified: Secondary | ICD-10-CM

## 2015-12-05 DIAGNOSIS — Z0000 Encounter for general adult medical examination without abnormal findings: Secondary | ICD-10-CM | POA: Diagnosis not present

## 2015-12-05 DIAGNOSIS — Z1159 Encounter for screening for other viral diseases: Secondary | ICD-10-CM | POA: Diagnosis not present

## 2015-12-05 DIAGNOSIS — Z114 Encounter for screening for human immunodeficiency virus [HIV]: Secondary | ICD-10-CM | POA: Diagnosis not present

## 2015-12-05 DIAGNOSIS — N400 Enlarged prostate without lower urinary tract symptoms: Secondary | ICD-10-CM | POA: Diagnosis not present

## 2015-12-05 DIAGNOSIS — R946 Abnormal results of thyroid function studies: Secondary | ICD-10-CM

## 2015-12-05 DIAGNOSIS — Z23 Encounter for immunization: Secondary | ICD-10-CM | POA: Diagnosis not present

## 2015-12-05 DIAGNOSIS — R972 Elevated prostate specific antigen [PSA]: Secondary | ICD-10-CM

## 2015-12-05 DIAGNOSIS — E785 Hyperlipidemia, unspecified: Secondary | ICD-10-CM

## 2015-12-05 DIAGNOSIS — F32A Depression, unspecified: Secondary | ICD-10-CM

## 2015-12-05 DIAGNOSIS — Z13220 Encounter for screening for lipoid disorders: Secondary | ICD-10-CM | POA: Diagnosis not present

## 2015-12-05 DIAGNOSIS — R7989 Other specified abnormal findings of blood chemistry: Secondary | ICD-10-CM

## 2015-12-05 LAB — POC MICROSCOPIC URINALYSIS (UMFC): Mucus: ABSENT

## 2015-12-05 LAB — COMPLETE METABOLIC PANEL WITH GFR
ALT: 15 U/L (ref 9–46)
AST: 18 U/L (ref 10–35)
Albumin: 4.1 g/dL (ref 3.6–5.1)
Alkaline Phosphatase: 45 U/L (ref 40–115)
BILIRUBIN TOTAL: 0.8 mg/dL (ref 0.2–1.2)
BUN: 18 mg/dL (ref 7–25)
CO2: 28 mmol/L (ref 20–31)
Calcium: 9 mg/dL (ref 8.6–10.3)
Chloride: 102 mmol/L (ref 98–110)
Creat: 0.98 mg/dL (ref 0.70–1.25)
GFR, EST NON AFRICAN AMERICAN: 79 mL/min (ref >=60)
GFR, Est African American: >89 mL/min (ref >=60)
GLUCOSE: 97 mg/dL (ref 65–99)
Potassium: 4.3 mmol/L (ref 3.5–5.3)
SODIUM: 137 mmol/L (ref 135–146)
TOTAL PROTEIN: 6.3 g/dL (ref 6.1–8.1)

## 2015-12-05 LAB — CBC WITH DIFFERENTIAL/PLATELET
Basophils Absolute: 0.1 10*3/uL (ref 0.0–0.1)
Basophils Relative: 1 % (ref 0–1)
Eosinophils Absolute: 0.2 10*3/uL (ref 0.0–0.7)
Eosinophils Relative: 3 % (ref 0–5)
HEMATOCRIT: 42.3 % (ref 39.0–52.0)
HEMOGLOBIN: 14.4 g/dL (ref 13.0–17.0)
Lymphocytes Relative: 19 % (ref 12–46)
Lymphs Abs: 1.1 10*3/uL (ref 0.7–4.0)
MCH: 28.6 pg (ref 26.0–34.0)
MCHC: 34 g/dL (ref 30.0–36.0)
MCV: 84.1 fL (ref 78.0–100.0)
MONO ABS: 0.6 10*3/uL (ref 0.1–1.0)
MONOS PCT: 10 % (ref 3–12)
MPV: 10.2 fL (ref 8.6–12.4)
NEUTROS ABS: 3.8 10*3/uL (ref 1.7–7.7)
Neutrophils Relative %: 67 % (ref 43–77)
Platelets: 209 10*3/uL (ref 150–400)
RBC: 5.03 MIL/uL (ref 4.22–5.81)
RDW: 14.2 % (ref 11.5–15.5)
WBC: 5.6 10*3/uL (ref 4.0–10.5)

## 2015-12-05 LAB — LIPID PANEL
Cholesterol: 187 mg/dL (ref 125–200)
HDL: 74 mg/dL (ref >=40)
LDL CALC: 101 mg/dL (ref <130)
Total CHOL/HDL Ratio: 2.5 Ratio (ref <=5.0)
Triglycerides: 58 mg/dL (ref <150)
VLDL: 12 mg/dL (ref <30)

## 2015-12-05 LAB — POCT URINALYSIS DIP (MANUAL ENTRY)
Bilirubin, UA: NEGATIVE
Blood, UA: NEGATIVE
GLUCOSE UA: NEGATIVE
Ketones, POC UA: NEGATIVE
Leukocytes, UA: NEGATIVE
NITRITE UA: NEGATIVE
Protein Ur, POC: NEGATIVE
Spec Grav, UA: 1.015
UROBILINOGEN UA: 0.2
pH, UA: 7

## 2015-12-05 LAB — TSH: TSH: 3.587 u[IU]/mL (ref 0.350–4.500)

## 2015-12-05 LAB — HIV ANTIBODY (ROUTINE TESTING W REFLEX): HIV 1&2 Ab, 4th Generation: NONREACTIVE

## 2015-12-05 LAB — HEPATITIS C ANTIBODY: HCV Ab: NEGATIVE

## 2015-12-05 MED ORDER — ATORVASTATIN CALCIUM 20 MG PO TABS: 20.0000 mg | ORAL_TABLET | Freq: Every day | ORAL | Status: DC

## 2015-12-05 MED ORDER — TAMSULOSIN HCL 0.4 MG PO CAPS: 0.4000 mg | ORAL_CAPSULE | Freq: Every day | ORAL | Status: DC

## 2015-12-05 MED ORDER — FLUOXETINE HCL 20 MG PO CAPS: 20.0000 mg | ORAL_CAPSULE | Freq: Every day | ORAL | Status: DC

## 2015-12-05 NOTE — Progress Notes (Signed)
Subjective:  This chart was scribed for Thomas GobbleSteven A Jakaree Pickard, MD by Veverly FellsHatice Demirci,scribe, at [facility].  This patient was seen in room 3 and the patient's care was started at 10:51 AM.    Patient ID: Thomas Mcbride, male    DOB: 08/14/1946, 69 y.o.   MRN: [id number] Chief Complaint  Patient presents with  . Annual Exam   HPI HPI Comments: Thomas MatterDonald Mcbride is a 69 y.o. male who presents to the [facility] for an annual physical exam.  He is up to date with his colonoscopy and sees his eye doctor 1 X per year.  Patient went to his heart doctor last year (Dr. Consuello ClossGanj).    Mood: Patient states that his mood has been doing "great" and started using his Prozac again recently.  He felt anxious during his move and thinks that contributed to starting up on his Prozac once again.  He couldn't tell "as much of a difference" on this medication this time around but has no complaints regarding it.   Medications: He has not been compliant with his atorvastatin but is willing to start taking it again.  He takes his baby aspirin daily and uses Flomax.  Patient would like a refill for his medications today.   Vaccinations: Patient is up to date with his flu vaccination.  He is willing to get his pneumonia vaccination in a couple of months.   Home life: Patient is currently moving into a new home and states that he is very busy especially around the holidays. Patient got divorced about 3.5 years ago.   Stress test 10/15/14: he myocardial profusion imaging was normal Workload 7.3 METS  Patient Active Problem List   Diagnosis Date Noted  . Nonspecific abnormal electrocardiogram (ECG) (EKG) 09/02/2014  . Depression 06/22/2014  . Other and unspecified hyperlipidemia 05/02/2013   Past Medical History  Diagnosis Date  . Depression   . Attention deficit hyperactivity disorder (ADHD)   . Allergy    Past Surgical History  Procedure Laterality Date  . Vasectomy    . Fracture  surgery  1972    leg   No Known Allergies Prior to Admission medications   Medication Sig Start Date End Date Taking? Authorizing Provider  aspirin EC 81 MG tablet Take 81 mg by mouth daily.    Historical Provider, MD  atorvastatin (LIPITOR) 20 MG tablet Take 1 tablet (20 mg total) by mouth daily. 10/02/14   Thomas GobbleSteven A Etana Beets, MD  Coenzyme Q10 (CO Q-10) 100 MG CAPS Take 1 tablet by mouth daily.     Historical Provider, MD  FLUoxetine (PROZAC) 20 MG capsule Take 1 capsule (20 mg total) by mouth daily. 10/02/14   Thomas GobbleSteven A Alexica Schlossberg, MD  GLUCOSAMINE-CHONDROITIN PO Take 1 tablet by mouth. occasionally when working out (4-6 monthly)    Historical Provider, MD  L-TYROSINE PO Take 1 tablet by mouth daily.    Historical Provider, MD  Multiple Vitamins-Minerals (CVS SPECTRAVITE SENIOR PO) Take 1 tablet by mouth daily.    Historical Provider, MD  tamsulosin (FLOMAX) 0.4 MG CAPS capsule Take 1 capsule (0.4 mg total) by mouth daily. 07/24/15   Thomas GobbleSteven A Takyah Ciaramitaro, MD  zoster vaccine live, PF, (ZOSTAVAX) [id number] UNT/0.65ML injection Inject 19,400 Units into the skin once. 10/02/14   Thomas GobbleSteven A Zanae Kuehnle, MD   Social History   Social History  . Marital Status: Divorced    Spouse Name: N/A  . Number of Children: N/A  . Years  of Education: N/A   Occupational History  . 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 Smoking status: Never Smoker   . Smokeless tobacco: Never Used  . Alcohol Use: No  . Drug Use: No  . Sexual Activity: No   Other Topics Concern  . Not on file   Social History Narrative   Exercises 3 x's week on eliptical machine.            Review of Systems  Constitutional: Negative for fever and chills.  Eyes: Negative for pain, redness and itching.  Respiratory: Negative for cough and choking.   Gastrointestinal: Negative for nausea and vomiting.  Musculoskeletal: Negative for neck pain and neck stiffness.  Neurological: Negative for syncope and speech difficulty.         Objective:   Physical Exam  Filed Vitals:   12/05/15 1004  BP: 126/80  Pulse: 48  Temp: 97.6 F (36.4 C)  Resp: 16  Height:  (1.727 m)  Weight: 147 lb (66.679 kg)     CONSTITUTIONAL: Well developed/well nourished HEAD: Normocephalic/atraumatic EYES: EOMI/PERRL ENMT: Mucous membranes moist NECK: supple no meningeal signs SPINE/BACK:entire spine nontender CV: S1/S2 noted, no murmurs/rubs/gallops noted LUNGS: Lungs are clear to auscultation bilaterally, no apparent distress ABDOMEN: soft, nontender, no rebound or guarding, bowel sounds noted throughout abdomen GU:no cva tenderness NEURO: Pt is awake/alert/appropriate, moves all extremitiesx4.  No facial droop.   EXTREMITIES: pulses normal/equal, full ROM SKIN: warm, color normal, He has a large varicosity inside of his right upper thigh.  PSYCH: no abnormalities of mood noted, alert and oriented to situation Prostate: normal.  EKG sinus bradycardia T-wave inversion the aVL no acute changes seen.     Assessment & Plan:  Patient received flu shot today. He has had a slight worsening of his depression since stopping his Prozac so he has restarted that. I did go ahead and refill his Lipitor and Flomax. He should follow-up in 6 months. He received his flu shot today will put in a future order for him to receive pneumo 23. I personally performed the services described in this documentation, which was scribed in my presence. The recorded information has been reviewed and is accurate. He will return to get 23 vaccine in 4-8 weeks.

## 2015-12-05 NOTE — Patient Instructions (Signed)

## 2015-12-06 LAB — PSA, MEDICARE: PSA: 1.95 ng/mL (ref <=4.00)

## 2015-12-19 LAB — POC HEMOCCULT BLD/STL (HOME/3-CARD/SCREEN)
Card #2 Fecal Occult Blod, POC: NEGATIVE
FECAL OCCULT BLD: NEGATIVE
FECAL OCCULT BLD: NEGATIVE

## 2015-12-19 NOTE — Addendum Note (Signed)
Addended by: Marcellus ScottADKINS, CONNIE L on: 12/19/2015 03:13 PM   Modules accepted: Orders

## 2016-05-19 ENCOUNTER — Ambulatory Visit (INDEPENDENT_AMBULATORY_CARE_PROVIDER_SITE_OTHER): Payer: Medicare Other | Admitting: Emergency Medicine

## 2016-05-19 VITALS — BP 144/78 | HR 52 | Temp 98.1°F | Resp 16 | Ht 67.0 in | Wt 147.0 lb

## 2016-05-19 DIAGNOSIS — E559 Vitamin D deficiency, unspecified: Secondary | ICD-10-CM | POA: Diagnosis not present

## 2016-05-19 DIAGNOSIS — I8390 Asymptomatic varicose veins of unspecified lower extremity: Secondary | ICD-10-CM

## 2016-05-19 DIAGNOSIS — I868 Varicose veins of other specified sites: Secondary | ICD-10-CM | POA: Diagnosis not present

## 2016-05-19 DIAGNOSIS — G2581 Restless legs syndrome: Secondary | ICD-10-CM | POA: Diagnosis not present

## 2016-05-19 DIAGNOSIS — M25561 Pain in right knee: Secondary | ICD-10-CM

## 2016-05-19 DIAGNOSIS — M25562 Pain in left knee: Secondary | ICD-10-CM

## 2016-05-19 DIAGNOSIS — E538 Deficiency of other specified B group vitamins: Secondary | ICD-10-CM | POA: Diagnosis not present

## 2016-05-19 LAB — COMPLETE METABOLIC PANEL WITH GFR
ALBUMIN: 4.3 g/dL (ref 3.6–5.1)
ALK PHOS: 55 U/L (ref 40–115)
ALT: 14 U/L (ref 9–46)
AST: 17 U/L (ref 10–35)
BILIRUBIN TOTAL: 0.4 mg/dL (ref 0.2–1.2)
BUN: 19 mg/dL (ref 7–25)
CALCIUM: 9.3 mg/dL (ref 8.6–10.3)
CO2: 22 mmol/L (ref 20–31)
CREATININE: 1.06 mg/dL (ref 0.70–1.25)
Chloride: 104 mmol/L (ref 98–110)
GFR, Est African American: 84 mL/min (ref >=60)
GFR, Est Non African American: 72 mL/min (ref >=60)
GLUCOSE: 117 mg/dL — AB (ref 65–99)
POTASSIUM: 4.3 mmol/L (ref 3.5–5.3)
SODIUM: 142 mmol/L (ref 135–146)
TOTAL PROTEIN: 6.3 g/dL (ref 6.1–8.1)

## 2016-05-19 LAB — POCT CBC
GRANULOCYTE PERCENT: 63.2 % (ref 37–80)
HEMATOCRIT: 42.4 % — AB (ref 43.5–53.7)
Hemoglobin: 14.5 g/dL (ref 14.1–18.1)
Lymph, poc: 2.2 (ref 0.6–3.4)
MCH: 28.1 pg (ref 27–31.2)
MCHC: 34.1 g/dL (ref 31.8–35.4)
MCV: 84.1 fL (ref 80–97)
MID (cbc): 0.8 (ref 0–0.9)
MPV: 8.5 fL (ref 0–99.8)
POC GRANULOCYTE: 5.2 (ref 2–6.9)
POC LYMPH %: 26.9 % (ref 10–50)
POC MID %: 9.9 %M (ref 0–12)
Platelet Count, POC: 193 10*3/uL (ref 142–424)
RBC: 5.04 M/uL (ref 4.69–6.13)
RDW, POC: 14.2 %
WBC: 8.3 10*3/uL (ref 4.6–10.2)

## 2016-05-19 LAB — MAGNESIUM: Magnesium: 2.2 mg/dL (ref 1.5–2.5)

## 2016-05-19 LAB — FERRITIN: Ferritin: 35 ng/mL (ref 20–380)

## 2016-05-19 LAB — CK: Total CK: 108 U/L (ref 7–232)

## 2016-05-19 LAB — VITAMIN B12: Vitamin B-12: 600 pg/mL (ref 200–1100)

## 2016-05-19 MED ORDER — GABAPENTIN 100 MG PO CAPS: ORAL_CAPSULE | ORAL | Status: DC

## 2016-05-19 NOTE — Patient Instructions (Addendum)
IF you received an x-ray today, you will receive an invoice from [facility]. Please contact [facility] at [phone] with questions or concerns regarding your invoice.   IF you received labwork today, you will receive an invoice from [employer]. Please contact [facility] at [phone] with questions or concerns regarding your invoice.   Our billing staff will not be able to assist you with questions regarding bills from these companies.  You will be contacted with the lab results as soon as they are available. The fastest way to get your results is to activate your My Chart account. Instructions are located on the last page of this paperwork. If you have not heard from [place] regarding the results in 2 weeks, please contact this office.     Varicose Veins Varicose veins are veins that have become enlarged and twisted. They are usually seen in the legs but can occur in other parts of the body as well. CAUSES This condition is the result of valves in the veins not working properly. Valves in the veins help to return blood from the leg to the heart. If these valves are damaged, blood flows backward and backs up into the veins in the leg near the skin. This causes the veins to become larger. RISK FACTORS People who are on their feet a lot, who are pregnant, or who are overweight are more likely to develop varicose veins. SIGNS AND SYMPTOMS  Bulging, twisted-appearing, bluish veins, most commonly found on the legs.  Leg pain or a feeling of heaviness. These symptoms may be worse at the end of the day.  Leg swelling.  Changes in skin color. DIAGNOSIS A health care provider can usually diagnose varicose veins by examining your legs. Your health care provider may also recommend an ultrasound of your leg veins. TREATMENT Most varicose veins can be treated at home.However, other treatments are available for people who have persistent symptoms or  want to improve the cosmetic appearance of the varicose veins. These treatment options include:  Sclerotherapy. A solution is injected into the vein to close it off.  Laser treatment. A laser is used to heat the vein to close it off.  Radiofrequency vein ablation. An electrical current produced by radio waves is used to close off the vein.  Phlebectomy. The vein is surgically removed through small incisions made over the varicose vein.  Vein ligation and stripping. The vein is surgically removed through incisions made over the varicose vein after the vein has been tied (ligated). HOME CARE INSTRUCTIONS  Do not stand or sit in one position for long periods of time. Do not sit with your legs crossed. Rest with your legs raised during the day.  Wear compression stockings as directed by your health care provider. These stockings help to prevent blood clots and reduce swelling in your legs.  Do not wear other tight, encircling garments around your legs, pelvis, or waist.  Walk as much as possible to increase blood flow.  Raise the foot of your bed at night with 2-inch blocks.  If you get a cut in the skin over the vein and the vein bleeds, lie down with your leg raised and press on it with a clean cloth until the bleeding stops. Then place a bandage (dressing) on the cut. See your health care provider if it continues to bleed. SEEK MEDICAL CARE IF:  The skin around your ankle starts to break down.  You have pain, redness, tenderness, or  hard swelling in your leg over a vein.  You are uncomfortable because of leg pain.   This information is not intended to replace advice given to you by your health care provider. Make sure you discuss any questions you have with your health care provider.   Document Released: 09/09/2005 Document Revised: 12/21/2014 Document Reviewed: 04/17/2014 [employer] Interactive Patient Education [employer]2016 [employer]

## 2016-05-19 NOTE — Progress Notes (Signed)
By signing my name below, I, Mesha Guinyard, attest that this documentation has been prepared under the direction and in the presence of Lesle ChrisSteven Daub, MD.  Electronically Signed: Arvilla MarketMesha Guinyard, Medical Scribe. 05/19/2016. 8:26 AM.  Chief Complaint:  Chief Complaint  Patient presents with  . Leg Pain    both, mostly left    HPI: Thomas Mcbride is a 70 y.o. male who [facility] complaining of leg pain in both legs L>R onset month ago. Pt reports leg cramps. Pt mentions he's getting restless legs while he's sleeping beginning a week ago. Pt mentions he had a medication in the past  that gave him the feeling he has now. Pt's varicose veins gives him a dull ache. Pt works part time at [employer], on a concrete floor, and his hours have been increased to about 44 hours. He's worked there for 7 years.  Past Medical History  Diagnosis Date  . Depression   . Attention deficit hyperactivity disorder (ADHD)   . Allergy    Past Surgical History  Procedure Laterality Date  . Vasectomy    . Fracture surgery  1972    leg   Social History   Social History  . Marital Status: Divorced    Spouse Name: N/A  . Number of Children: N/A  . Years of Education: N/A   Occupational History  . 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 Smoking status: Never Smoker   . Smokeless tobacco: Never Used  . Alcohol Use: No  . Drug Use: No  . Sexual Activity: No   Other Topics Concern  . None   Social History Narrative   Exercises 3 x's week on eliptical machine.         Family History  Problem Relation Age of Onset  . Crohn's disease Mother   . Cancer Mother     brain  . Heart attack Father   . Lymphoma Father   . Cancer Father     marrow  . Heart disease Father   . Hyperlipidemia Father   . Hypertension Father     ?  Marland Kitchen. COPD Paternal Grandfather    No Known Allergies Prior to Admission medications   Medication Sig Start Date End Date  Taking? Authorizing Provider  aspirin EC 81 MG tablet Take 81 mg by mouth daily.    Historical Provider, MD  atorvastatin (LIPITOR) 20 MG tablet Take 1 tablet (20 mg total) by mouth daily. 12/05/15   Collene GobbleSteven A Daub, MD  Coenzyme Q10 (CO Q-10) 100 MG CAPS Take 1 tablet by mouth daily.     Historical Provider, MD  FLUoxetine (PROZAC) 20 MG capsule Take 1 capsule (20 mg total) by mouth daily. 12/05/15   Collene GobbleSteven A Daub, MD  GLUCOSAMINE-CHONDROITIN PO Take 1 tablet by mouth. occasionally when working out (4-6 monthly)    Historical Provider, MD  L-TYROSINE PO Take 1 tablet by mouth daily.    Historical Provider, MD  Multiple Vitamins-Minerals ([employer]S SPECTRAVITE SENIOR PO) Take 1 tablet by mouth daily.    Historical Provider, MD  tamsulosin (FLOMAX) 0.4 MG CAPS capsule Take 1 capsule (0.4 mg total) by mouth daily. 12/05/15   Collene GobbleSteven A Daub, MD     ROS: The patient denies fevers, chills, night sweats, unintentional weight loss, chest pain, palpitations, wheezing, dyspnea on exertion, nausea, vomiting, abdominal pain, dysuria, hematuria, melena, numbness, weakness, or tingling.  All other systems have been reviewed and were otherwise  negative with the exception of those mentioned in the HPI and as above.    PHYSICAL EXAM: Filed Vitals:   05/19/16 0821  BP: 144/78  Pulse: 52  Temp: 98.1 F (36.7 C)  Resp: 16   Body mass index is 23.02 kg/(m^2).   General: Alert, no acute distress HEENT:  Normocephalic, atraumatic, oropharynx patent. Eye: Nonie Hoyer [facility] Cardiovascular:  Regular rate and rhythm, no rubs murmurs or gallops.  No Carotid bruits, radial pulse intact. No pedal edema.  Respiratory: Clear to auscultation bilaterally.  No wheezes, rales, or rhonchi.  No cyanosis, no use of accessory musculature Abdominal: No organomegaly, abdomen is soft and non-tender, positive bowel sounds.  No masses. Musculoskeletal: Gait intact. No edema, tenderness. Severe varicose vein involving both legs. His pulse  is 2+ knee, pulse is absent in ankles. Bunion deformity in left toe. Skin: No rashes. Neurologic: Facial musculature symmetric. Psychiatric: Patient acts appropriately throughout our interaction. Lymphatic: No cervical or submandibular lymphadenopathy   LABS: Results for orders placed or performed in visit on 05/19/16  POCT CBC  Result Value Ref Range   WBC 8.3 4.6 - 10.2 K/uL   Lymph, poc 2.2 0.6 - 3.4   POC LYMPH PERCENT 26.9 10 - 50 %L   MID (cbc) 0.8 0 - 0.9   POC MID % 9.9 0 - 12 %M   POC Granulocyte 5.2 2 - 6.9   Granulocyte percent 63.2 37 - 80 %G   RBC 5.04 4.69 - 6.13 M/uL   Hemoglobin 14.5 14.1 - 18.1 g/dL   HCT, POC 16.1 (A) 09.6 - 53.7 %   MCV 84.1 80 - 97 fL   MCH, POC 28.1 27 - 31.2 pg   MCHC 34.1 31.8 - 35.4 g/dL   RDW, POC 04.5 %   Platelet Count, POC 193 142 - 424 K/uL   MPV 8.5 0 - 99.8 fL     EKG/XRAY:   Primary read interpreted by Dr. Cleta Alberts at [facility].   ASSESSMENT/PLAN: Patient has bilateral varicose veins. He's been on his feet a lot working more hours at work. We'll place him on Neurontin 1:59 at night. Routine labs were done. I also advised him to wear support stockings.I personally performed the services described in this documentation, which was scribed in my presence. The recorded information has been reviewed and is accurate.   Gross sideeffects, risk and benefits, and alternatives of medications d/w patient. Patient is aware that all medications have potential sideeffects and we are unable to predict every sideeffect or drug-drug interaction that may occur.  Lesle Chris MD 05/19/2016 8:26 AM

## 2016-05-19 NOTE — Addendum Note (Signed)
Addended by: Lesle ChrisAUB, Darbie Biancardi A on: 05/19/2016 09:18 AM   Modules accepted: Orders

## 2016-05-20 ENCOUNTER — Telehealth: Payer: Self-pay | Admitting: Emergency Medicine

## 2016-05-20 LAB — VITAMIN D 25 HYDROXY (VIT D DEFICIENCY, FRACTURES): VIT D 25 HYDROXY: 36 ng/mL (ref 30–100)

## 2016-05-20 NOTE — Telephone Encounter (Signed)
Left  results on VM- Instructed pt to call clinic if any concerns

## 2016-05-20 NOTE — Telephone Encounter (Signed)
-----   Message from Collene GobbleSteven A Daub, MD sent at 05/20/2016  7:28 AM EDT ----- No metabolic cause of his cramps. We will see how the gabapentin does. Referrals made to the vein specialist.

## 2016-06-03 DIAGNOSIS — I8312 Varicose veins of left lower extremity with inflammation: Secondary | ICD-10-CM | POA: Diagnosis not present

## 2016-06-03 DIAGNOSIS — I8311 Varicose veins of right lower extremity with inflammation: Secondary | ICD-10-CM | POA: Diagnosis not present

## 2016-06-04 DIAGNOSIS — I8312 Varicose veins of left lower extremity with inflammation: Secondary | ICD-10-CM | POA: Diagnosis not present

## 2016-06-04 DIAGNOSIS — I8311 Varicose veins of right lower extremity with inflammation: Secondary | ICD-10-CM | POA: Diagnosis not present

## 2016-06-18 ENCOUNTER — Ambulatory Visit (INDEPENDENT_AMBULATORY_CARE_PROVIDER_SITE_OTHER): Payer: Medicare Other | Admitting: Physician Assistant

## 2016-06-18 ENCOUNTER — Ambulatory Visit (INDEPENDENT_AMBULATORY_CARE_PROVIDER_SITE_OTHER): Payer: Medicare Other

## 2016-06-18 VITALS — BP 150/70 | HR 72 | Temp 97.6°F | Resp 16 | Ht 67.0 in | Wt 151.0 lb

## 2016-06-18 DIAGNOSIS — M25561 Pain in right knee: Secondary | ICD-10-CM

## 2016-06-18 DIAGNOSIS — M1711 Unilateral primary osteoarthritis, right knee: Secondary | ICD-10-CM

## 2016-06-18 DIAGNOSIS — E7800 Pure hypercholesterolemia, unspecified: Secondary | ICD-10-CM | POA: Insufficient documentation

## 2016-06-18 DIAGNOSIS — M129 Arthropathy, unspecified: Secondary | ICD-10-CM | POA: Diagnosis not present

## 2016-06-18 DIAGNOSIS — M179 Osteoarthritis of knee, unspecified: Secondary | ICD-10-CM | POA: Diagnosis not present

## 2016-06-18 MED ORDER — MELOXICAM 7.5 MG PO TABS: 7.5000 mg | ORAL_TABLET | Freq: Two times a day (BID) | ORAL | Status: DC | PRN

## 2016-06-18 NOTE — Progress Notes (Signed)
Thomas Mcbride  MRN: [id number] DOB: 10/25/1946  Subjective:  Pt presents to clinic with right knee pain for about 3 weeks ago without any injury.  He has some clicking with movement but no locking or giving away.    Works standing most of the day on concrete floors at [employer].  He has been working 10 hours shifts -typically 40 hours a week.  He feels like the pain is worse after he has worked multiple days in a row - when he has a few days off the pain improves and then when he goes back to work the pain returns.   Pt has been using neoprene hinged knee brace when his knee really starts to hurt but it is hot and does irritate his skin over time.  [place] vein is working on his varicose veins.  Patient Active Problem List   Diagnosis Date Noted  . Elevated cholesterol 06/18/2016  . BPH (benign prostatic hyperplasia) 12/05/2015  . Nonspecific abnormal electrocardiogram (ECG) (EKG) 09/02/2014  . Depression 06/22/2014    Current Outpatient Prescriptions on File Prior to Visit  Medication Sig Dispense Refill  . aspirin EC 81 MG tablet Take 81 mg by mouth daily.    Marland Kitchen. atorvastatin (LIPITOR) 20 MG tablet Take 1 tablet (20 mg total) by mouth daily. 90 tablet 3  . Coenzyme Q10 (CO Q-10) 100 MG CAPS Take 1 tablet by mouth daily.     Marland Kitchen. FLUoxetine (PROZAC) 20 MG capsule Take 1 capsule (20 mg total) by mouth daily. 90 capsule 3  . GLUCOSAMINE-CHONDROITIN PO Take 1 tablet by mouth. occasionally when working out (4-6 monthly)    . L-TYROSINE PO Take 1 tablet by mouth daily.    . Multiple Vitamins-Minerals (CVS SPECTRAVITE SENIOR PO) Take 1 tablet by mouth daily.    . tamsulosin (FLOMAX) 0.4 MG CAPS capsule Take 1 capsule (0.4 mg total) by mouth daily. 90 capsule 3   No current facility-administered medications on file prior to visit.    No Known Allergies  Review of Systems  Constitutional: Negative for fever and chills.  Musculoskeletal: Negative for back pain, joint swelling and gait  problem.   Objective:  BP 150/70 mmHg  Pulse 72  Temp(Src) 97.6 F (36.4 C) (Oral)  Resp 16  Ht 5\' 7"  (1.702 m)  Wt 151 lb (68.493 kg)  BMI 23.64 kg/m2  SpO2 98%  Physical Exam  Constitutional: He is oriented to person, place, and time and well-developed, well-nourished, and in no distress.  HENT:  Head: Normocephalic and atraumatic.  Right Ear: External ear normal.  Left Ear: External ear normal.  Eyes: Conjunctivae are normal.  Neck: Normal range of motion.  Pulmonary/Chest: Effort normal.  Musculoskeletal:       Right knee: He exhibits decreased range of motion (lacks full extension and flexion). He exhibits no swelling, no LCL laxity, normal patellar mobility, normal meniscus and no MCL laxity. Tenderness found. Medial joint line tenderness noted.       Left knee: Normal.  Neurological: He is alert and oriented to person, place, and time. Gait normal.  Skin: Skin is warm and dry.  Psychiatric: Mood, memory, affect and judgment normal.    Dg Knee 1-2 Views Right  06/18/2016  CLINICAL DATA:  Knee pain for 3 weeks, no known injury EXAM: RIGHT KNEE - 1-2 VIEW COMPARISON:  None. FINDINGS: On the right there is tricompartmental degenerative joint disease of the right knee primarily involving the medial and patellofemoral compartments. There is considerable loss  of joint space of these two compartments with sclerosis and spurring present. Also, there does appear to be a loose body in the right knee joint space, with a tiny amount of joint fluid present. There is abnormality of the proximal right tibia as well which appears to be due to a healed fracture of the proximal right tibia. A healed fracture the proximal right fibula also is noted. On the left, there is only minimal loss of medial joint space. No acute abnormality seen on the single image obtained. IMPRESSION: 1. 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 2. Loose body in the right knee joint space with a small amount of right knee joint fluid. 3. Healed fractures of the proximal right tibia and fibula. Electronically Signed   By: Dwyane DeePaul  Barry M.D.   On: 06/18/2016 09:28    Assessment and Plan :  Arthritis of right knee - Plan: meloxicam (MOBIC) 7.5 MG tablet  Right knee pain - Plan: DG Knee 1-2 Views Right   Pt has significant arthritis in his right knee which would explain his overuse type pain.  We will place an OA reaction brace from [employer] in hopes that it will give him some support esp to the medial aspect of his knee.  He will try Mobic to see if that will give him pain relief. We gave him a note to give him several days off at his request and talked about other options of short work days to maybe help with this flair.  We discussed that this can occur frequently with his OA.  Benny LennertSarah Weber PA-C  [facility] 06/18/2016 5:45 PM

## 2016-06-18 NOTE — Patient Instructions (Signed)
  Ice to the area.  Pain medications as needed.    IF you received an x-ray today, you will receive an invoice from [facility]. Please contact [facility] at [phone] with questions or concerns regarding your invoice.   IF you received labwork today, you will receive an invoice from [facility]. Please contact [facility] at [phone] with questions or concerns regarding your invoice.   Our billing staff will not be able to assist you with questions regarding bills from these companies.  You will be contacted with the lab results as soon as they are available. The fastest way to get your results is to activate your My Chart account. Instructions are located on the last page of this paperwork. If you have not heard from us regarding the results in 2 weeks, please contact this office.

## 2016-06-24 DIAGNOSIS — I8311 Varicose veins of right lower extremity with inflammation: Secondary | ICD-10-CM | POA: Diagnosis not present

## 2016-06-24 DIAGNOSIS — I8312 Varicose veins of left lower extremity with inflammation: Secondary | ICD-10-CM | POA: Diagnosis not present

## 2016-07-14 DIAGNOSIS — I8312 Varicose veins of left lower extremity with inflammation: Secondary | ICD-10-CM | POA: Diagnosis not present

## 2016-07-15 ENCOUNTER — Other Ambulatory Visit: Payer: Self-pay

## 2016-07-15 MED ORDER — ATORVASTATIN CALCIUM 20 MG PO TABS: 20.0000 mg | ORAL_TABLET | Freq: Every day | ORAL | 1 refills | Status: DC

## 2016-07-15 MED ORDER — FLUOXETINE HCL 20 MG PO CAPS: 20.0000 mg | ORAL_CAPSULE | Freq: Every day | ORAL | 1 refills | Status: DC

## 2016-07-15 MED ORDER — TAMSULOSIN HCL 0.4 MG PO CAPS: 0.4000 mg | ORAL_CAPSULE | Freq: Every day | ORAL | 1 refills | Status: DC

## 2016-07-16 DIAGNOSIS — I8311 Varicose veins of right lower extremity with inflammation: Secondary | ICD-10-CM | POA: Diagnosis not present

## 2016-08-05 DIAGNOSIS — I8311 Varicose veins of right lower extremity with inflammation: Secondary | ICD-10-CM | POA: Diagnosis not present

## 2016-08-07 DIAGNOSIS — I8311 Varicose veins of right lower extremity with inflammation: Secondary | ICD-10-CM | POA: Diagnosis not present

## 2016-08-19 DIAGNOSIS — I8312 Varicose veins of left lower extremity with inflammation: Secondary | ICD-10-CM | POA: Diagnosis not present

## 2016-08-21 DIAGNOSIS — I8312 Varicose veins of left lower extremity with inflammation: Secondary | ICD-10-CM | POA: Diagnosis not present

## 2016-08-25 DIAGNOSIS — I83811 Varicose veins of right lower extremities with pain: Secondary | ICD-10-CM | POA: Diagnosis not present

## 2016-08-25 DIAGNOSIS — I87321 Chronic venous hypertension (idiopathic) with inflammation of right lower extremity: Secondary | ICD-10-CM | POA: Diagnosis not present

## 2016-08-25 DIAGNOSIS — I8311 Varicose veins of right lower extremity with inflammation: Secondary | ICD-10-CM | POA: Diagnosis not present

## 2016-09-16 DIAGNOSIS — I8311 Varicose veins of right lower extremity with inflammation: Secondary | ICD-10-CM | POA: Diagnosis not present

## 2016-09-23 ENCOUNTER — Ambulatory Visit (INDEPENDENT_AMBULATORY_CARE_PROVIDER_SITE_OTHER): Payer: Medicare Other

## 2016-09-23 ENCOUNTER — Ambulatory Visit (INDEPENDENT_AMBULATORY_CARE_PROVIDER_SITE_OTHER): Payer: Medicare Other | Admitting: Family Medicine

## 2016-09-23 VITALS — BP 122/72 | HR 55 | Temp 97.9°F | Resp 17 | Ht 68.5 in | Wt 145.0 lb

## 2016-09-23 DIAGNOSIS — K219 Gastro-esophageal reflux disease without esophagitis: Secondary | ICD-10-CM | POA: Diagnosis not present

## 2016-09-23 DIAGNOSIS — R1013 Epigastric pain: Secondary | ICD-10-CM | POA: Diagnosis not present

## 2016-09-23 DIAGNOSIS — R109 Unspecified abdominal pain: Secondary | ICD-10-CM | POA: Diagnosis not present

## 2016-09-23 MED ORDER — RANITIDINE HCL 150 MG PO TABS: 150.0000 mg | ORAL_TABLET | Freq: Two times a day (BID) | ORAL | 0 refills | Status: DC

## 2016-09-23 NOTE — Patient Instructions (Addendum)
Take Ranitidine 150 mg twice daily for minimal of 2 weeks or until symptoms resolve. Constipation noted on x-ray. Take stool softer daily, increase water intake.  May take Miralax over the counter as needed to loosen stool burden.  I have referred you to Gastroenterology for possible evaluation for EGD. May take benadryl 12.5 or 25 mg before bedtime to improve sleep.  Return for care if symptoms do not improve with medication.  Godfrey PickKimberly S. Tiburcio PeaHarris, MSN, FNP-C [facility]  IF you received an x-ray today, you will receive an invoice from [facility]. Please contact [facility] at [phone] with questions or concerns regarding your invoice.   IF you received labwork today, you will receive an invoice from [facility]. Please contact [facility] at [phone] with questions or concerns regarding your invoice.   Our billing staff will not be able to assist you with questions regarding bills from these companies.  You will be contacted with the lab results as soon as they are available. The fastest way to get your results is to activate your My Chart account. Instructions are located on the last page of this paperwork. If you have not heard from us regarding the results in 2 weeks, please contact this office.

## 2016-09-23 NOTE — Progress Notes (Signed)
Patient ID: Thomas Mcbride, male    DOB: 05-15-1946, 70 y.o.   MRN: [id number]  PCP: Lucilla Edin, MD  Chief Complaint  Patient presents with  . Abdominal Pain    Subjective:   HPI 70 year old presents for evaluation of abdominal pain times 2 weeks. Aching and shooting mid epigastric pain, no burning in throat.  Pain fluctuates with intensity. Pain is not worsened with food.  Pain is worst in morning and weans off throughout the day to what he describes as "mild discomfort". He reports taking ranitidine 75 mg and which has improved symptoms.  He has regular daily bowel movements. No nausea and vomiting.  Insomnia Reports 4-5 days not sleeping well unrelated to abdominal discomfort. Hasn't been working out due to [place] vein procedures and feels that this lack of activity is related to sleep dysfunction.  Social History   Social History  . Marital status: Divorced    Spouse name: N/A  . Number of children: N/A  . Years of education: N/A   Occupational History  . 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 Smoking status: Never Smoker  . Smokeless tobacco: Never Used  . Alcohol use No  . Drug use: No  . Sexual activity: No   Other Topics Concern  . Not on file   Social History Narrative   Exercises 3 x's week on eliptical machine.         Family History  Problem Relation Age of Onset  . Crohn's disease Mother   . Cancer Mother     brain  . Heart attack Father   . Lymphoma Father   . Cancer Father     marrow  . Heart disease Father   . Hyperlipidemia Father   . Hypertension Father     ?  Marland Kitchen COPD Paternal Grandfather    Review of Systems See HPI   Patient Active Problem List   Diagnosis Date Noted  . Elevated cholesterol 06/18/2016  . BPH (benign prostatic hyperplasia) 12/05/2015  . Nonspecific abnormal electrocardiogram (ECG) (EKG) 09/02/2014  . Depression 06/22/2014     Prior to Admission medications   Medication Sig  Start Date End Date Taking? Authorizing Provider  aspirin EC 81 MG tablet Take 81 mg by mouth daily.   Yes Historical Provider, MD  atorvastatin (LIPITOR) 20 MG tablet Take 1 tablet (20 mg total) by mouth daily. 07/15/16  Yes [name], MD  Coenzyme Q10 (CO Q-10) 100 MG CAPS Take 1 tablet by mouth daily.    Yes Historical Provider, MD  FLUoxetine (PROZAC) 20 MG capsule Take 1 capsule (20 mg total) by mouth daily. 07/15/16  Yes [name], MD  GLUCOSAMINE-CHONDROITIN PO Take 1 tablet by mouth. occasionally when working out (4-6 monthly)   Yes Historical Provider, MD  L-TYROSINE PO Take 1 tablet by mouth daily.   Yes Historical Provider, MD  Multiple Vitamins-Minerals (CVS SPECTRAVITE SENIOR PO) Take 1 tablet by mouth daily.   Yes Historical Provider, MD  tamsulosin (FLOMAX) 0.4 MG CAPS capsule Take 1 capsule (0.4 mg total) by mouth daily. 07/15/16  Yes [name], MD   No Known Allergies   Objective:  Physical Exam  Constitutional: He is oriented to person, place, and time. He appears well-developed and well-nourished.  HENT:  Head: Normocephalic and atraumatic.  Right Ear: External ear normal.  Left Ear: External ear normal.  Nose: Nose normal.  Eyes: Conjunctivae and EOM are normal.  Pupils are equal, round, and reactive to light.  Neck: Normal range of motion.  Cardiovascular: Normal rate, regular rhythm, normal heart sounds and intact distal pulses.   Pulmonary/Chest: Effort normal and breath sounds normal.  Abdominal: Soft. Bowel sounds are normal. He exhibits no distension and no mass. There is no tenderness. There is no rebound and no guarding.  Musculoskeletal: Normal range of motion.  Neurological: He is alert and oriented to person, place, and time.  Skin: Skin is warm and dry.  Psychiatric: His behavior is normal. Judgment and thought content normal.  Mildly anxious.    Vitals:   09/23/16 1012  BP: 122/72  Pulse: (!) 55  Resp: 17  Temp: 97.9 F (36.6 C)    Assessment & Plan:  1. Midepigastric pain - DG Abd 2 Views;  2. Gastroesophageal reflux disease, esophagitis presence not specified  70 year old, male presents with 2 week history of mild to moderate abdominal pain.  He reports past history of abdominal ulcer and acid reflux. Physical exam was completely unremarkable. His symptoms responded to treatment with ranitidine 75 mg. I will increase ranitidine 150 mg twice daily for 2 weeks. May continue regimen longer if needed. Referred patient to gastroenterology to evaluate for an EGD.  Plan: 1. Midepigastric pain - DG Abd 2 Views;  2. Gastroesophageal reflux disease, esophagitis presence not specified  . ranitidine (ZANTAC) 150 MG tablet    Sig: Take 1 tablet (150 mg total) by mouth 2 (two) times daily.    Dispense:  60 tablet   - Ambulatory referral to Gastroenterology   3. Insomnia      May take benadryl 12.5 or 25 mg before bedtime to improve sleep.  Return for care if symptoms do not improve with medication.  Godfrey PickKimberly S. Tiburcio PeaHarris, MSN, FNP-C [facility]

## 2016-10-01 DIAGNOSIS — I8312 Varicose veins of left lower extremity with inflammation: Secondary | ICD-10-CM | POA: Diagnosis not present

## 2016-10-07 DIAGNOSIS — R1084 Generalized abdominal pain: Secondary | ICD-10-CM | POA: Diagnosis not present

## 2016-10-07 DIAGNOSIS — K219 Gastro-esophageal reflux disease without esophagitis: Secondary | ICD-10-CM | POA: Diagnosis not present

## 2016-10-12 ENCOUNTER — Ambulatory Visit: Payer: Medicare Other

## 2016-10-21 ENCOUNTER — Ambulatory Visit (INDEPENDENT_AMBULATORY_CARE_PROVIDER_SITE_OTHER): Payer: Medicare Other | Admitting: Family Medicine

## 2016-10-21 VITALS — BP 148/80 | HR 63 | Temp 97.7°F | Resp 16 | Ht 68.5 in | Wt 146.0 lb

## 2016-10-21 DIAGNOSIS — M25542 Pain in joints of left hand: Secondary | ICD-10-CM | POA: Diagnosis not present

## 2016-10-21 DIAGNOSIS — F330 Major depressive disorder, recurrent, mild: Secondary | ICD-10-CM | POA: Diagnosis not present

## 2016-10-21 DIAGNOSIS — M25541 Pain in joints of right hand: Secondary | ICD-10-CM | POA: Diagnosis not present

## 2016-10-21 LAB — SEDIMENTATION RATE: SED RATE: 5 mm/h (ref 0–20)

## 2016-10-21 NOTE — Patient Instructions (Addendum)
I will contact you with your lab results.  You may take the Meloxicam 7.5 mg as needed for pain (previously prescribed).  Follow-up as needed.  Thomas PickKimberly S. Tiburcio PeaHarris, MSN, FNP-C [facility]   IF you received an x-ray today, you will receive an invoice from [facility]. Please contact [facility] at [phone] with questions or concerns regarding your invoice.   IF you received labwork today, you will receive an invoice from [facility]. Please contact [facility] at [phone] with questions or concerns regarding your invoice.   Our billing staff will not be able to assist you with questions regarding bills from these companies.  You will be contacted with the lab results as soon as they are available. The fastest way to get your results is to activate your My Chart account. Instructions are located on the last page of this paperwork. If you have not heard from us regarding the results in 2 weeks, please contact this office.     Arthritis Arthritis means joint pain. It can also mean joint disease. A joint is a place where bones come together. People who have arthritis may have:  Red joints.  Swollen joints.  Stiff joints.  Warm joints.  A fever.  A feeling of being sick. HOME CARE Pay attention to any changes in your symptoms. Take these actions to help with your pain and swelling. Medicines  Take over-the-counter and prescription medicines only as told by your doctor.  Do not take aspirin for pain if your doctor says that you may have gout. Activities  Rest your joint if your doctor tells you to.  Avoid activities that make the pain worse.  Exercise your joint regularly as told by your doctor. Try doing exercises like:  Swimming.  Water aerobics.  Biking.  Walking. Joint Care  If your joint is swollen, keep it raised (elevated) if told by your doctor.  If your joint feels  stiff in the morning, try taking a warm shower.  If you have diabetes, do not apply heat without asking your doctor.  If told, apply heat to the joint:  Put a towel between the joint and the hot pack or heating pad.  Leave the heat on the area for 20-30 minutes.  If told, apply ice to the joint:  Put ice in a plastic bag.  Place a towel between your skin and the bag.  Leave the ice on for 20 minutes, 2-3 times per day.  Keep all follow-up visits as told by your doctor. GET HELP IF:  The pain gets worse.  You have a fever. GET HELP RIGHT AWAY IF:  You have very bad pain in your joint.  You have swelling in your joint.  Your joint is red.  Many joints become painful and swollen.  You have very bad back pain.  Your leg is very weak.  You cannot control your pee (urine) or poop (stool).   This information is not intended to replace advice given to you by your health care provider. Make sure you discuss any questions you have with your health care provider.   Document Released: 02/24/2010 Document Revised: 08/21/2015 Document Reviewed: 02/25/2015 Elsevier Interactive Patient Education [employer]2016 [employer] Inc.

## 2016-10-21 NOTE — Progress Notes (Signed)
Patient ID: Thomas Mcbride, male    DOB: 10/09/1946, 70 y.o.   MRN: [id number]  PCP: Joaquin CourtsKimberly Liberti Appleton, FNP  Chief Complaint  Patient presents with  . Arthritis    All over body, right thumb "cramps"    Subjective:   HPI 70 year old male presents for evaluation of arthralgias of bilateral hands intermittently for months. [facility] is [facility]. He presents today requesting a work-up for progressive worsening pain bilateraly hand joint pain. He reports that hand joints are worst in the morning and improves as the day progresses. Characterizes pain as aching. Joints are not reddened but appear very swollen. Has not noticed any increased warmth. Reports that around the second week of October he experienced a cramp in right thumb which left a slight indention to the lateral side of his thumb.  Depression Patient reports that he tapered himself off of Prozac in early October by decreasing his dose in 1/2 for a period of two weeks. He reports now taking a mood supplement twice per week called DLPA. He denies any known side effects but reports that he feels his mood is currently stable.  Social History   Social History  . Marital status: Divorced    Spouse name: N/A  . Number of children: N/A  . Years of education: N/A   Occupational History  . 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 Smoking status: Never Smoker  . Smokeless tobacco: Never Used  . Alcohol use No  . Drug use: No  . Sexual activity: No   Other Topics Concern  . Not on file   Social History Narrative   Exercises 3 x's week on eliptical machine.          Family History  Problem Relation Age of Onset  . Crohn's disease Mother   . Cancer Mother     brain  . Heart attack Father   . Lymphoma Father   . Cancer Father     marrow  . Heart disease Father   . Hyperlipidemia Father   . Hypertension Father     ?  Marland Kitchen. COPD Paternal Grandfather     Review of Systems See  HPI  Patient Active Problem List   Diagnosis Date Noted  . Elevated cholesterol 06/18/2016  . BPH (benign prostatic hyperplasia) 12/05/2015  . Nonspecific abnormal electrocardiogram (ECG) (EKG) 09/02/2014  . Depression 06/22/2014     Prior to Admission medications   Medication Sig Start Date End Date Taking? Authorizing Provider  aspirin EC 81 MG tablet Take 81 mg by mouth daily.   Yes Historical Provider, MD  atorvastatin (LIPITOR) 20 MG tablet Take 1 tablet (20 mg total) by mouth daily. 07/15/16  Yes Collene GobbleSteven A Daub, MD  Coenzyme Q10 (CO Q-10) 100 MG CAPS Take 1 tablet by mouth daily.    Yes Historical Provider, MD  GLUCOSAMINE-CHONDROITIN PO Take 1 tablet by mouth. occasionally when working out (4-6 monthly)   Yes Historical Provider, MD  L-TYROSINE PO Take 1 tablet by mouth daily.   Yes Historical Provider, MD  Multiple Vitamins-Minerals (CVS SPECTRAVITE SENIOR PO) Take 1 tablet by mouth daily.   Yes Historical Provider, MD  tamsulosin (FLOMAX) 0.4 MG CAPS capsule Take 1 capsule (0.4 mg total) by mouth daily. 07/15/16  Yes Collene GobbleSteven A Daub, MD  UNABLE TO FIND DLPA   Yes Historical Provider, MD  FLUoxetine (PROZAC) 20 MG capsule Take 1 capsule (20 mg total) by mouth daily. Patient  not taking: Reported on 10/21/2016 07/15/16   Collene GobbleSteven A Daub, MD  No Known Allergies     Objective:  Physical Exam  Constitutional: He is oriented to person, place, and time. He appears well-developed and well-nourished.  HENT:  Head: Normocephalic and atraumatic.  Eyes: Conjunctivae are normal. Pupils are equal, round, and reactive to light.  Neck: Normal range of motion. Neck supple.  Cardiovascular: Normal rate, regular rhythm, normal heart sounds and intact distal pulses.   Pulmonary/Chest: Effort normal and breath sounds normal.  Musculoskeletal:       Right hand: He exhibits deformity and swelling.       Left hand: He exhibits deformity and swelling.  Symmetrical swelling of bilateral hand DIP joints.   Neurological: He is alert and oriented to person, place, and time.  Skin: Skin is warm and dry.  Psychiatric: He has a normal mood and affect. His behavior is normal. Judgment and thought content normal.   Vitals:   10/21/16 0901  BP: (!) 148/80  Pulse: 63  Resp: 16  Temp: 97.7 F (36.5 C)      Assessment & Plan:  1. Arthralgia of both hands - C-reactive protein - Rheumatoid factor - Sedimentation rate - Cyclic Citrul Peptide Antibody, IGG  2. Mild episode of recurrent major depressive disorder (HCC), stable  Patient reports he is taking a supplement called DLPA for depression and reports mood stability. Provided caution to patient regarding the efficacy of supplements and that they are not FDA approved. If depressive symptoms become problematic, will consider placing back on an SSRI.  Will follow-up regarding lab results.  Godfrey PickKimberly S. Tiburcio PeaHarris, MSN, FNP-C [facility]

## 2016-10-22 LAB — CYCLIC CITRUL PEPTIDE ANTIBODY, IGG: Cyclic Citrullin Peptide Ab: <16 Units

## 2016-10-22 LAB — C-REACTIVE PROTEIN: CRP: 1.4 mg/L (ref <8.0)

## 2016-10-22 LAB — RHEUMATOID FACTOR

## 2016-10-23 DIAGNOSIS — I8312 Varicose veins of left lower extremity with inflammation: Secondary | ICD-10-CM | POA: Diagnosis not present

## 2016-11-12 ENCOUNTER — Ambulatory Visit (INDEPENDENT_AMBULATORY_CARE_PROVIDER_SITE_OTHER): Payer: Medicare Other | Admitting: Orthopedic Surgery

## 2016-11-18 ENCOUNTER — Ambulatory Visit (INDEPENDENT_AMBULATORY_CARE_PROVIDER_SITE_OTHER): Payer: Medicare Other

## 2016-11-18 ENCOUNTER — Ambulatory Visit (INDEPENDENT_AMBULATORY_CARE_PROVIDER_SITE_OTHER): Payer: Medicare Other | Admitting: Orthopedic Surgery

## 2016-11-18 ENCOUNTER — Encounter (INDEPENDENT_AMBULATORY_CARE_PROVIDER_SITE_OTHER): Payer: Self-pay | Admitting: Orthopedic Surgery

## 2016-11-18 DIAGNOSIS — M79641 Pain in right hand: Secondary | ICD-10-CM

## 2016-11-18 DIAGNOSIS — G8929 Other chronic pain: Secondary | ICD-10-CM

## 2016-11-18 DIAGNOSIS — M25561 Pain in right knee: Secondary | ICD-10-CM

## 2016-11-18 DIAGNOSIS — I8311 Varicose veins of right lower extremity with inflammation: Secondary | ICD-10-CM | POA: Diagnosis not present

## 2016-11-18 DIAGNOSIS — M79642 Pain in left hand: Secondary | ICD-10-CM

## 2016-11-18 MED ORDER — DICLOFENAC SODIUM 2 % TD SOLN: 2.0000 | Freq: Two times a day (BID) | TRANSDERMAL | 0 refills | Status: DC
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
## 2016-11-21 ENCOUNTER — Telehealth: Payer: Self-pay

## 2016-11-21 NOTE — Telephone Encounter (Signed)
Please advise 

## 2016-11-21 NOTE — Telephone Encounter (Signed)
Pt is calling after being referred to [facility] he was given a sample of this topical solution and he really liked the solution and it seemed to help with his joints he was not qualified by insurance for a month sample from [facility] however he was curious if Jerrilyn CairoKim Harris could give him a prescription for it    Medication (pennsaid) diclofenac sodium topical solution  2% w/w   Please advise  [phone]

## 2016-11-23 ENCOUNTER — Other Ambulatory Visit: Payer: Self-pay | Admitting: Family Medicine

## 2016-11-23 MED ORDER — DICLOFENAC SODIUM 2 % TD SOLN: TRANSDERMAL | 2 refills | Status: DC

## 2016-11-23 NOTE — Progress Notes (Unsigned)
Medication request for Pennsaid topical has been filled. Please notify patient.

## 2016-11-26 ENCOUNTER — Telehealth: Payer: Self-pay | Admitting: Radiology

## 2016-11-26 MED ORDER — DICLOFENAC SODIUM 2 % TD SOLN: 2.0000 | Freq: Two times a day (BID) | TRANSDERMAL | 0 refills | Status: DC

## 2016-11-26 NOTE — Telephone Encounter (Signed)
Pt is wanting to have the Diclofenac Sodium (PENNSAID) 2 % SOLN sent to [facility] on [address]. Please advise.

## 2016-11-26 NOTE — Telephone Encounter (Signed)
Medication (Diclofenac Sodium) was originally ordered and sent to his pharmacy on file 11/23/16.  I will reorder medication and [facility] [address]

## 2016-11-26 NOTE — Progress Notes (Signed)
Informed pt of refill

## 2016-12-08 ENCOUNTER — Telehealth: Payer: Self-pay

## 2016-12-08 NOTE — Telephone Encounter (Signed)
PATIENT WOULD LIKE TO ASK KIMBERLY HARRIS IF SHE WOULD GIVE HIM ABOUT 6 OF HIS TAMSULOSIN 0.4 MG PILLS TO LAST UNTIL HIS PRESCRIPTION COMES IN THRU HIS [facility]? HE SAID HE SHOULD BE GETTING IT ANYTIME NOW, HOWEVER, HE IS NOT EXACTLY SURE WHEN IT WILL GET HERE. BEST PHONE [phone] (CELL)  PHARMACY CHOICE IS [facility] ON [address].  MBC

## 2016-12-10 MED ORDER — TAMSULOSIN HCL 0.4 MG PO CAPS: 0.4000 mg | ORAL_CAPSULE | Freq: Every day | ORAL | 0 refills | Status: DC

## 2016-12-10 NOTE — Telephone Encounter (Signed)
Sent in 1 mos RF locally and notified pt on VM.

## 2016-12-15 ENCOUNTER — Other Ambulatory Visit: Payer: Self-pay

## 2016-12-15 MED ORDER — TAMSULOSIN HCL 0.4 MG PO CAPS: 0.4000 mg | ORAL_CAPSULE | Freq: Every day | ORAL | 0 refills | Status: DC

## 2016-12-15 MED ORDER — ATORVASTATIN CALCIUM 20 MG PO TABS: 20.0000 mg | ORAL_TABLET | Freq: Every day | ORAL | 0 refills | Status: DC

## 2017-01-02 ENCOUNTER — Ambulatory Visit (INDEPENDENT_AMBULATORY_CARE_PROVIDER_SITE_OTHER): Payer: Medicare Other | Admitting: Family Medicine

## 2017-01-02 VITALS — BP 110/60 | HR 66 | Temp 97.4°F | Resp 18 | Ht 67.0 in | Wt 151.1 lb

## 2017-01-02 DIAGNOSIS — M62830 Muscle spasm of back: Secondary | ICD-10-CM | POA: Diagnosis not present

## 2017-01-02 MED ORDER — TRAMADOL HCL 50 MG PO TABS: 50.0000 mg | ORAL_TABLET | Freq: Three times a day (TID) | ORAL | 0 refills | Status: DC | PRN

## 2017-01-02 MED ORDER — CYCLOBENZAPRINE HCL 5 MG PO TABS: 5.0000 mg | ORAL_TABLET | Freq: Every evening | ORAL | 0 refills | Status: DC | PRN

## 2017-01-02 NOTE — Progress Notes (Signed)
Thomas Mcbride is a 71 y.o. male who [facility] today for back pain:  1.  Back pain:  Back pain:  Describes aching pain in BL lumbar region of back, worse when standing or prolonged sitting in one position.  Not worse on left versus right side. Pain is 6 / 10, not relieved with meloxicam which he has at home.  No injuries that he can remember. No radiation to buttocks or legs. No paresthesias or numbness in his legs  No LE weakness or changes in gait.  No motor weakness/decreased sensation BL LE's.  No fevers or chills.  No dysuria, hematuria, urinary frequency, incontinence of bladder or bowel.     ROS as above.  Pertinently, no chest pain, palpitations, SOB, Fever, Chills, Abd pain, N/V/D.   PMH reviewed. Patient is a nonsmoker.   Past Medical History:  Diagnosis Date  . Allergy   . Attention deficit hyperactivity disorder (ADHD)   . Depression    Past Surgical History:  Procedure Laterality Date  . FRACTURE SURGERY  1972   leg  . VASECTOMY      Medications reviewed. Current Outpatient Prescriptions  Medication Sig Dispense Refill  . aspirin EC 81 MG tablet Take 81 mg by mouth daily.    Marland Kitchen. atorvastatin (LIPITOR) 20 MG tablet Take 1 tablet (20 mg total) by mouth daily. 90 tablet 0  . Coenzyme Q10 (CO Q-10) 100 MG CAPS Take 1 tablet by mouth daily.     Marland Kitchen. FLUoxetine (PROZAC) 20 MG capsule Take 1 capsule (20 mg total) by mouth daily. 90 capsule 1  . GLUCOSAMINE-CHONDROITIN PO Take 1 tablet by mouth. occasionally when working out (4-6 monthly)    . L-TYROSINE PO Take 1 tablet by mouth daily.    . Multiple Vitamins-Minerals (CVS SPECTRAVITE SENIOR PO) Take 1 tablet by mouth daily.    . tamsulosin (FLOMAX) 0.4 MG CAPS capsule Take 1 capsule (0.4 mg total) by mouth daily. 90 capsule 0  . UNABLE TO FIND DLPA    . Diclofenac Sodium (PENNSAID) 2 % SOLN Apply 2 sprays to the effected joints twice daily. (Patient not taking: Reported on 01/02/2017) 1 Bottle 2  .  Diclofenac Sodium (PENNSAID) 2 % SOLN Place 2 Squirts onto the skin 2 (two) times daily. (Patient not taking: Reported on 01/02/2017) 1 Bottle 0   No current facility-administered medications for this visit.      Physical Exam:  BP 110/60   Pulse 66   Temp 97.4 F (36.3 C) (Oral)   Resp 18   Ht 5\' 7"  (1.702 m)   Wt 151 lb 2 oz (68.5 kg)   SpO2 97%   BMI 23.67 kg/m  Gen:  Alert, cooperative patient who appears stated age in no acute distress.  Vital signs reviewed. HEENT: EOMI,  MMM Pulm:  Clear to auscultation bilaterally with good air movement.  No wheezes or rales noted.   Cardiac:  Regular rate and rhythm without murmur auscultated.  Good S1/S2. Abd:  Soft/nondistended/nontender.  Good bowel sounds throughout all four quadrants.  No masses noted.  Back:  Normal skin, Spine with normal alignment and no deformity.  No tenderness to vertebral process palpation.  Paraspinous muscles are tender with some spasm noted BL lumbar region.   Range of motion is full at neck and decreased forward flexion lumbar sacral regions.  Straight leg raise is positive on Right for back pain. Neuro:  Sensation and motor function 5/5 bilateral lower extremities.  DTR's +2 patellar BL.     Assessment and Plan:  1.  Back spasm: - mostly likely secondary to Lumbar strain.   Plan of rest, intermittent application of cold packs (later, may switch to heat, but do not sleep on heating pad), analgesics and muscle relaxants. Discussed longer term treatment plan of prn NSAID's and home back care exercise program with flexion exercise routine.  Tramadol for worsened pain. Low dose flexeril, which has helped him in past.   Proper lifting mechanics with avoidance of heavy lifting discussed.  Consider Physical Therapy and XRay studies if not improving.  Call or return to clinic prn if these symptoms worsen or fail to improve as anticipated.  Return immediately if worsening.

## 2017-01-02 NOTE — Patient Instructions (Addendum)
  You have tweaked your back as we discussed.  This has led to a muscle spasm.  Continue taking the meloxicam as you have been for its anti-inflammatory effects. I'm sending and tramadol for pain relief. He can take this every 8 hours if you need to.  Take the Flexeril as a muscle relaxer at night. This may make you drowsy and if it does do not take it during the day.  Heat and massage are also great to help relieve the pain.  This can last for the next 7-10 days. If you're still having issues in the next 2 weeks come back and see us.  If you start having worsening pain despite the treatment don't wait and come back immediately.    IF you received an x-ray today, you will receive an invoice from [facility]. Please contact [facility] at [phone] with questions or concerns regarding your invoice.   IF you received labwork today, you will receive an invoice from [facility]. Please contact [facility] at [phone] with questions or concerns regarding your invoice.   Our billing staff will not be able to assist you with questions regarding bills from these companies.  You will be contacted with the lab results as soon as they are available. The fastest way to get your results is to activate your My Chart account. Instructions are located on the last page of this paperwork. If you have not heard from us regarding the results in 2 weeks, please contact this office.

## 2017-07-06 ENCOUNTER — Other Ambulatory Visit: Payer: Self-pay | Admitting: Physician Assistant

## 2017-09-13 ENCOUNTER — Other Ambulatory Visit: Payer: Self-pay | Admitting: Physician Assistant

## 2017-09-27 ENCOUNTER — Telehealth: Payer: Self-pay | Admitting: Family Medicine

## 2017-09-27 NOTE — Telephone Encounter (Signed)
Tolsma - The patient has been working with [facility] to get his prescriptions filled.  He doesn't know what the problem is, but he only has 6 days left of the tamsulosin and atorvastatin.  Can we please get this filled?  He says he is usually given 90-days worth at a time.  You filled these for him last in January.  I told him he may need an appointment since Joaquin Courts was the last to see him.  [phone]

## 2017-09-30 NOTE — Telephone Encounter (Signed)
Pt calling back stating that someone called him from this office

## 2017-09-30 NOTE — Telephone Encounter (Signed)
Spoke to pt. Explained the refusal.  Pt has not had labs or visit since 05/2016 for statin, etc. Pt understood and will call back to make appt.

## 2017-10-04 ENCOUNTER — Telehealth: Payer: Self-pay | Admitting: Family Medicine

## 2017-10-04 ENCOUNTER — Ambulatory Visit (INDEPENDENT_AMBULATORY_CARE_PROVIDER_SITE_OTHER): Payer: Medicare Other | Admitting: Family Medicine

## 2017-10-04 ENCOUNTER — Encounter: Payer: Self-pay | Admitting: Family Medicine

## 2017-10-04 VITALS — BP 138/78 | HR 78 | Temp 97.5°F | Resp 18 | Ht 67.0 in | Wt 144.0 lb

## 2017-10-04 DIAGNOSIS — F988 Other specified behavioral and emotional disorders with onset usually occurring in childhood and adolescence: Secondary | ICD-10-CM | POA: Diagnosis not present

## 2017-10-04 DIAGNOSIS — Z8659 Personal history of other mental and behavioral disorders: Secondary | ICD-10-CM

## 2017-10-04 DIAGNOSIS — N401 Enlarged prostate with lower urinary tract symptoms: Secondary | ICD-10-CM | POA: Diagnosis not present

## 2017-10-04 DIAGNOSIS — R3911 Hesitancy of micturition: Secondary | ICD-10-CM | POA: Diagnosis not present

## 2017-10-04 DIAGNOSIS — E785 Hyperlipidemia, unspecified: Secondary | ICD-10-CM

## 2017-10-04 MED ORDER — TAMSULOSIN HCL 0.4 MG PO CAPS: 0.4000 mg | ORAL_CAPSULE | Freq: Every day | ORAL | 1 refills | Status: DC

## 2017-10-04 MED ORDER — ATORVASTATIN CALCIUM 20 MG PO TABS: 20.0000 mg | ORAL_TABLET | Freq: Every day | ORAL | 0 refills | Status: DC

## 2017-10-04 MED ORDER — ATORVASTATIN CALCIUM 20 MG PO TABS: 20.0000 mg | ORAL_TABLET | Freq: Every day | ORAL | 1 refills | Status: DC

## 2017-10-04 MED ORDER — TAMSULOSIN HCL 0.4 MG PO CAPS: 0.4000 mg | ORAL_CAPSULE | Freq: Every day | ORAL | 0 refills | Status: DC

## 2017-10-04 NOTE — Patient Instructions (Addendum)
  I sent 30 day supply of flomax and lipitor to local pharmacy and [facility] day to [facility].   If lipids elevated on bloodwork - may need to repeat after 8 hours fasting.   If persistent depression symptoms, let me know and we can restart Prozac or try low dose Wellbutrin as that can also help ADD symptoms. Update me by MyChart in next month, or feel free to come in to discuss further.   Return to the clinic or go to the nearest emergency room if any of your symptoms worsen or new symptoms occur.     IF you received an x-ray today, you will receive an invoice from [facility]. Please contact [facility] at [phone] with questions or concerns regarding your invoice.   IF you received labwork today, you will receive an invoice from [facility]. Please contact [facility] at [phone] with questions or concerns regarding your invoice.   Our billing staff will not be able to assist you with questions regarding bills from these companies.  You will be contacted with the lab results as soon as they are available. The fastest way to get your results is to activate your My Chart account. Instructions are located on the last page of this paperwork. If you have not heard from us regarding the results in 2 weeks, please contact this office.

## 2017-10-04 NOTE — Telephone Encounter (Signed)
Pt was seen today by Dr. Neva SeatGreene and was given Rx for 90 days.

## 2017-10-04 NOTE — Telephone Encounter (Signed)
Pt says the lady he spoke to ([facility] told him that we could give him a 30-day supply on the tamsulosin until he can get in for an appointment.  I tried to get him to schedule, but he said he couldn't today and would call back when he can.  [facility] [address].

## 2017-10-04 NOTE — Progress Notes (Signed)
Subjective:    Patient ID: Thomas Mcbride, male    DOB: 08/06/1946, 71 y.o.   MRN: [id number]  I,Thomas Mcbride,acting as a scribe for Shade FloodGREENE,Syed Zukas Mcbride, Thomas Mcbride.,have documented all relevant documentation on the behalf of Thomas Borden Mcbride, Thomas Mcbride,as directed by  Shade FloodGREENE,Flynn Gwyn Mcbride, Thomas Mcbride while in the presence of Shade FloodGREENE,Shawntina Diffee Mcbride, Thomas Mcbride.   Chief Complaint  Patient presents with  . Medication Refill    labwork for medication refill   flomax, lipitor   . Depression    discuss     HPI Thomas Mcbride is a 71 y.o. male who presents to Primary Care at [facility] for the following:  Medication Refill  Pertinent negatives include no abdominal pain, chest pain, coughing, fatigue or headaches.  Depression         Associated symptoms include no fatigue and no headaches.  For his depression he was taking Prozac 20 mg once daily in the past, but I have not seen him since 2016 when he was seen for low back pain. Last evaluated by Joaquin CourtsKimberly Harris in 10/2016. He had tapered himself off Prozac last October and was taking a mood supplement. He felt like his symptoms were stable at that time.  Today he notes doing well since tapering off Prozac. He notes that for the last month he has been having a stressful time in his life. In the past he was diagnosed with ADD at the age of 71. He is able to overall control it without medication. He wonders if Prozac has positive effects on helping his concentration. He had taken Wellbutrin in the past for his depression. He used this medication for a while, but he started getting restless leg syndrome. After stopping Wellbutrin this RLS stopped. unknown dose.  He would like to continue to watch his symptoms and mood for the next month without medication before trying Wellbutrin or Prozac again.   Hyperlipidemia Lab Results  Component Value Date   CHOL 187 12/05/2015   HDL 74 12/05/2015   LDLCALC 101 12/05/2015   TRIG 58 12/05/2015   CHOLHDL 2.5 12/05/2015   Lab Results  Component Value  Date   ALT 14 05/19/2016   AST 17 05/19/2016   ALKPHOS 55 05/19/2016   BILITOT 0.4 05/19/2016  Previously had taken Lipitor once a day. Today he denies any new side effect from medication and wants to continue.    Lab Results  Component Value Date   PSA 1.95 12/05/2015   PSA 2.40 04/02/2015   PSA 2.93 10/02/2014  Today, he is requesting a Flomax refill. He is only taking this for his enlarged prostate. He denies new trouble urinating or trouble starting a stream. He wakes up about once a night to urinate. He denies any lightheadedness or dizziness with this medication. He notes in the past he got lightheaded in the past so he takes it at night before he goes to sleep.   For his medication review, he reports he has been taking 81 mg aspirin and wonders if he should take this long term. He is no longer taking flexeril, tramadol, Prozac or Diclofenac.   Today he denies chest pain, SOB or difficulty breathing. He notes being on his feet a lot at work and wonders if he should try chondroitin for any future pain/discomfort.    Patient Active Problem List   Diagnosis Date Noted  . Pain of left hand 11/18/2016  . Right hand pain 11/18/2016  . Chronic pain of right knee 11/18/2016  . Elevated cholesterol 06/18/2016  .  BPH (benign prostatic hyperplasia) 12/05/2015  . Nonspecific abnormal electrocardiogram (ECG) (EKG) 09/02/2014  . Depression 06/22/2014   Past Medical History:  Diagnosis Date  . Allergy   . Attention deficit hyperactivity disorder (ADHD)   . Depression    Past Surgical History:  Procedure Laterality Date  . FRACTURE SURGERY  1972   leg  . VASECTOMY     No Known Allergies Prior to Admission medications   Medication Sig Start Date End Date Taking? Authorizing Provider  aspirin EC 81 MG tablet Take 81 mg by mouth daily.   Yes Provider, Historical, Thomas Mcbride  atorvastatin (LIPITOR) 20 MG tablet Take 1 tablet (20 mg total) by mouth daily. 12/15/16  Yes Ethelda Chick, Thomas Mcbride    Coenzyme Q10 (CO Q-10) 100 MG CAPS Take 1 tablet by mouth daily.    Yes Provider, Historical, Thomas Mcbride  cyclobenzaprine (FLEXERIL) 5 MG tablet Take 1 tablet (5 mg total) by mouth at bedtime as needed for muscle spasms. 01/02/17  Yes Tobey Grim, Thomas Mcbride  FLUoxetine (PROZAC) 20 MG capsule Take 1 capsule (20 mg total) by mouth daily. 07/15/16  Yes [name], Thomas Mcbride  GLUCOSAMINE-CHONDROITIN PO Take 1 tablet by mouth. occasionally when working out (4-6 monthly)   Yes Provider, Historical, Thomas Mcbride  L-TYROSINE PO Take 1 tablet by mouth daily.   Yes Provider, Historical, Thomas Mcbride  Multiple Vitamins-Minerals (CVS SPECTRAVITE SENIOR PO) Take 1 tablet by mouth daily.   Yes Provider, Historical, Thomas Mcbride  tamsulosin (FLOMAX) 0.4 MG CAPS capsule Take 1 capsule (0.4 mg total) by mouth daily. 12/15/16  Yes Ethelda Chick, Thomas Mcbride  traMADol (ULTRAM) 50 MG tablet Take 1 tablet (50 mg total) by mouth every 8 (eight) hours as needed. 01/02/17  Yes Tobey Grim, Thomas Mcbride  UNABLE TO FIND DLPA   Yes Provider, Historical, Thomas Mcbride  Diclofenac Sodium (PENNSAID) 2 % SOLN Apply 2 sprays to the effected joints twice daily. Patient not taking: Reported on 01/02/2017 11/23/16   Thomas Neighbors, FNP  Diclofenac Sodium (PENNSAID) 2 % SOLN Place 2 Squirts onto the skin 2 (two) times daily. Patient not taking: Reported on 01/02/2017 11/26/16   Thomas Neighbors, FNP   Social History   Social History  . Marital status: Divorced    Spouse name: N/A  . Number of children: N/A  . Years of education: N/A   Occupational History  . 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 Smoking status: Never Smoker  . Smokeless tobacco: Never Used  . Alcohol use No  . Drug use: No  . Sexual activity: No   Other Topics Concern  . Not on file   Social History Narrative   Exercises 3 x's week on eliptical machine.          Review of Systems  Constitutional: Negative for fatigue and unexpected weight change.  Eyes: Negative for visual  disturbance.  Respiratory: Negative for cough, chest tightness and shortness of breath.   Cardiovascular: Negative for chest pain, palpitations and leg swelling.  Gastrointestinal: Negative for abdominal pain and blood in stool.  Genitourinary: Negative for difficulty urinating.  Neurological: Negative for dizziness, light-headedness and headaches.  Psychiatric/Behavioral: Positive for depression.       Positive for ADD       Objective:   Physical Exam  Constitutional: He is oriented to person, place, and time. He appears well-developed and well-nourished.  HENT:  Head: Normocephalic and atraumatic.  Eyes: Pupils are equal, round, and reactive to light.  EOM are normal.  Neck: No JVD present. Carotid bruit is not present.  Cardiovascular: Normal rate, regular rhythm and normal heart sounds.   No murmur heard. Pulmonary/Chest: Effort normal and breath sounds normal. He has no rales.  Musculoskeletal: He exhibits no edema.  Neurological: He is alert and oriented to person, place, and time.  Skin: Skin is warm and dry.  Psychiatric: He has a normal mood and affect.  Vitals reviewed.    Vitals:   10/04/17 1616  BP: 138/78  Pulse: 78  Resp: 18  Temp: (!) 97.5 F (36.4 C)  TempSrc: Oral  SpO2: 96%  Weight: 144 lb (65.3 kg)  Height: 5\' 7"  (1.702 m)        Assessment & Plan:   Vuk Skillern is a 71 y.o. male Hyperlipidemia, unspecified hyperlipidemia type - Plan: Comprehensive metabolic panel, Lipid panel, atorvastatin (LIPITOR) 20 MG tablet, DISCONTINUED: atorvastatin (LIPITOR) 20 MG tablet  - Labs pending, tolerating Lipitor, continue same dose. If lipids elevated, may need to repeat testing after full 8 hour fast  Benign prostatic hyperplasia with urinary hesitancy - Plan: PSA, tamsulosin (FLOMAX) 0.4 MG CAPS capsule, DISCONTINUED: tamsulosin (FLOMAX) 0.4 MG CAPS capsule  -Stable symptoms, tolerating Flomax, continue same dose. Check PSA  Attention deficit disorder,  unspecified hyperactivity presence  -Reported history of ADD. Not currently on medication and he would like to hold off on any stimulant medication due to potential cardiac risks. Discussed potential Wellbutrin for depression symptoms as it may also provide some benefit if he does have ADD. He would like to wait on new medications at present.  History of depression  -Currently stable off meds. Option of restarting Prozac, or possible Wellbutrin at low dose as listed above due to potential ADD symptoms. If he were to restart Wellbutrin and any RLS symptoms, would switch back to Prozac.   Meds ordered this encounter  Medications  . DISCONTD: atorvastatin (LIPITOR) 20 MG tablet    Sig: Take 1 tablet (20 mg total) by mouth daily.    Dispense:  30 tablet    Refill:  0  . DISCONTD: tamsulosin (FLOMAX) 0.4 MG CAPS capsule    Sig: Take 1 capsule (0.4 mg total) by mouth daily.    Dispense:  30 capsule    Refill:  0  . atorvastatin (LIPITOR) 20 MG tablet    Sig: Take 1 tablet (20 mg total) by mouth daily.    Dispense:  90 tablet    Refill:  1  . tamsulosin (FLOMAX) 0.4 MG CAPS capsule    Sig: Take 1 capsule (0.4 mg total) by mouth daily.    Dispense:  90 capsule    Refill:  1   Patient Instructions    I sent 30 day supply of flomax and lipitor to local pharmacy and 90 day to [facility].   If lipids elevated on bloodwork - may need to repeat after 8 hours fasting.   If persistent depression symptoms, let me know and we can restart Prozac or try low dose Wellbutrin as that can also help ADD symptoms. Update me by MyChart in next month, or feel free to come in to discuss further.   Return to the clinic or go to the nearest emergency room if any of your symptoms worsen or new symptoms occur.     IF you received an x-ray today, you will receive an invoice from [facility]. Please contact [facility] at [phone] with questions or concerns regarding your invoice.  IF you  received labwork today, you will receive an invoice from [facility]. Please contact [facility] at [phone] with questions or concerns regarding your invoice.   Our billing staff will not be able to assist you with questions regarding bills from these companies.  You will be contacted with the lab results as soon as they are available. The fastest way to get your results is to activate your My Chart account. Instructions are located on the last page of this paperwork. If you have not heard from [place] regarding the results in 2 weeks, please contact this office.       I personally performed the services described in this documentation, which was scribed in my presence. The recorded information has been reviewed and considered for accuracy and completeness, addended by me as needed, and agree with information above.  Signed,   Meredith Staggers, Thomas Mcbride [facility] at [facility].  10/06/17 11:51 AM

## 2017-10-05 LAB — COMPREHENSIVE METABOLIC PANEL
A/G RATIO: 2 (ref 1.2–2.2)
ALT: 15 IU/L (ref 0–44)
AST: 16 IU/L (ref 0–40)
Albumin: 4.5 g/dL (ref 3.6–4.8)
Alkaline Phosphatase: 68 IU/L (ref 39–117)
BUN/Creatinine Ratio: 20 (ref 10–24)
BUN: 21 mg/dL (ref 8–27)
Bilirubin Total: 0.4 mg/dL (ref 0.0–1.2)
CALCIUM: 9.3 mg/dL (ref 8.6–10.2)
CO2: 22 mmol/L (ref 20–29)
Chloride: 102 mmol/L (ref 96–106)
Creatinine, Ser: 1.03 mg/dL (ref 0.76–1.27)
GFR calc Af Amer: 85 mL/min/{1.73_m2} (ref >59)
GFR, EST NON AFRICAN AMERICAN: 74 mL/min/{1.73_m2} (ref >59)
GLOBULIN, TOTAL: 2.2 g/dL (ref 1.5–4.5)
Glucose: 121 mg/dL — ABNORMAL HIGH (ref 65–99)
POTASSIUM: 4.4 mmol/L (ref 3.5–5.2)
SODIUM: 141 mmol/L (ref 134–144)
Total Protein: 6.7 g/dL (ref 6.0–8.5)

## 2017-10-05 LAB — LIPID PANEL
CHOL/HDL RATIO: 2.5 ratio (ref 0.0–5.0)
Cholesterol, Total: 167 mg/dL (ref 100–199)
HDL: 66 mg/dL (ref >39)
LDL Calculated: 84 mg/dL (ref 0–99)
TRIGLYCERIDES: 84 mg/dL (ref 0–149)
VLDL Cholesterol Cal: 17 mg/dL (ref 5–40)

## 2017-10-05 LAB — PSA: Prostate Specific Ag, Serum: 2.5 ng/mL (ref 0.0–4.0)

## 2017-10-18 ENCOUNTER — Encounter: Payer: Self-pay | Admitting: *Deleted

## 2018-02-08 ENCOUNTER — Other Ambulatory Visit: Payer: Self-pay

## 2018-02-08 NOTE — Patient Outreach (Signed)
[facility] Care Management  02/08/2018  Thomas Mcbride 07/04/1946 [id number]   Medication Adherence call to Mr. Thomas Mcbride patient is showing past due under [facility] Ins. on Atorvastatin 20 mg spoke with patient he said he recently pick up Atorvastatin from the pharmacy on January/2019 for a 90 days supply patient is not due until April/2019.   Lillia AbedAna Ollison-Moran CPhT Pharmacy Technician [facility] Care Management Direct Dial [phone]  Fax [phone] [email or web address] [email or web address]

## 2018-02-28 ENCOUNTER — Other Ambulatory Visit: Payer: Self-pay | Admitting: *Deleted

## 2018-02-28 ENCOUNTER — Telehealth: Payer: Self-pay | Admitting: Family Medicine

## 2018-02-28 DIAGNOSIS — R3911 Hesitancy of micturition: Principal | ICD-10-CM

## 2018-02-28 DIAGNOSIS — N401 Enlarged prostate with lower urinary tract symptoms: Secondary | ICD-10-CM

## 2018-02-28 MED ORDER — TAMSULOSIN HCL 0.4 MG PO CAPS: 0.4000 mg | ORAL_CAPSULE | Freq: Every day | ORAL | 1 refills | Status: DC

## 2018-02-28 NOTE — Telephone Encounter (Signed)
Copied from CRM [id number]. Topic: Quick Communication - See Telephone Encounter >> Feb 28, 2018 11:30 AM Windy KalataMichael, Malene Blaydes L, NT wrote: CRM for notification. See Telephone encounter for:  02/28/18.  Patient is calling and states he does not have any refill on tamsulosin (FLOMAX) 0.4 MG CAPS capsule. Was instructed by his pharmacy to contact Dr. Neva SeatGreene that prescribed that. Patient is needing a refill please advise.   [facility] [address] - [place] ([address]), [place] - [address]  [address] [place] [address]  Phone: [phone] Fax: [phone]

## 2018-03-29 ENCOUNTER — Encounter (HOSPITAL_COMMUNITY): Payer: Self-pay | Admitting: Emergency Medicine

## 2018-03-29 ENCOUNTER — Other Ambulatory Visit: Payer: Self-pay

## 2018-03-29 ENCOUNTER — Emergency Department (HOSPITAL_COMMUNITY)
Admission: EM | Admit: 2018-03-29 | Discharge: 2018-03-30 | Disposition: A | Discharge status: Home / Self Care | Payer: Medicare Other | Attending: Emergency Medicine | Admitting: Emergency Medicine

## 2018-03-29 DIAGNOSIS — Z79899 Other long term (current) drug therapy: Secondary | ICD-10-CM | POA: Diagnosis not present

## 2018-03-29 DIAGNOSIS — Z7982 Long term (current) use of aspirin: Secondary | ICD-10-CM | POA: Insufficient documentation

## 2018-03-29 DIAGNOSIS — R339 Retention of urine, unspecified: Secondary | ICD-10-CM | POA: Diagnosis not present

## 2018-03-29 HISTORY — DX: Benign prostatic hyperplasia without lower urinary tract symptoms: N40.0

## 2018-03-29 NOTE — ED Triage Notes (Signed)
Pt reports that he hasn't been able to urinate x 4 hours. States that he has a hx of BPH and took his flomax later than usual.

## 2018-03-30 LAB — URINALYSIS, ROUTINE W REFLEX MICROSCOPIC
BILIRUBIN URINE: NEGATIVE
GLUCOSE, UA: NEGATIVE mg/dL
KETONES UR: NEGATIVE mg/dL
LEUKOCYTES UA: NEGATIVE
NITRITE: NEGATIVE
PROTEIN: NEGATIVE mg/dL
Specific Gravity, Urine: 1.005 (ref 1.005–1.030)
pH: 6 (ref 5.0–8.0)

## 2018-03-30 NOTE — ED Provider Notes (Signed)
[facility] EMERGENCY DEPARTMENT Provider Note   CSN: [id number] Arrival date & time: 03/29/18  2305     History   Chief Complaint Chief Complaint  Patient presents with  . Urinary Retention    HPI Thomas Mcbride is a 72 y.o. male.  The history is provided by the patient and medical records.   72 y.o. M with hx of ADHD, BPH, depression, allergies, presenting to the ED for urinary retention.  States he has not been able to urinate in approx 4 hours.  He has hx of BPH, takes flomax at night but took it later than normal today.  States his abdomen feels feel and there is pressure over his bladder.  No fever/chills.  Not currently established with urologist--- saw them several years ago but no recent follow-up.  Past Medical History:  Diagnosis Date  . Allergy   . Attention deficit hyperactivity disorder (ADHD)   . BPH (benign prostatic hyperplasia)   . Depression     Patient Active Problem List   Diagnosis Date Noted  . Pain of left hand 11/18/2016  . Right hand pain 11/18/2016  . Chronic pain of right knee 11/18/2016  . Elevated cholesterol 06/18/2016  . BPH (benign prostatic hyperplasia) 12/05/2015  . Nonspecific abnormal electrocardiogram (ECG) (EKG) 09/02/2014  . Depression 06/22/2014    Past Surgical History:  Procedure Laterality Date  . FRACTURE SURGERY  1972   leg  . VASECTOMY          Home Medications    Prior to Admission medications   Medication Sig Start Date End Date Taking? Authorizing Provider  aspirin EC 81 MG tablet Take 81 mg by mouth daily.    [provider]  atorvastatin (LIPITOR) 20 MG tablet Take 1 tablet (20 mg total) by mouth daily. 10/04/17   Shade Flood, MD  Coenzyme Q10 (CO Q-10) 100 MG CAPS Take 1 tablet by mouth daily.     [provider]  GLUCOSAMINE-CHONDROITIN PO Take 1 tablet by mouth. occasionally when working out (4-6 monthly)    [provider]  L-TYROSINE PO Take 1 tablet by  mouth daily.    [provider]  Multiple Vitamins-Minerals ([employer]S SPECTRAVITE SENIOR PO) Take 1 tablet by mouth daily.    [provider]  tamsulosin (FLOMAX) 0.4 MG CAPS capsule Take 1 capsule (0.4 mg total) by mouth daily. 02/28/18   Shade Flood, MD  UNABLE TO FIND DLPA    [provider]    Family History Family History  Problem Relation Age of Onset  . Crohn's disease Mother   . Cancer Mother        brain  . Heart attack Father   . Lymphoma Father   . Cancer Father        marrow  . Heart disease Father   . Hyperlipidemia Father   . Hypertension Father        ?  Marland Kitchen COPD Paternal Grandfather     Social History Social History   Tobacco Use  . Smoking status: Never Smoker  . Smokeless tobacco: Never Used  Substance Use Topics  . Alcohol use: No  . Drug use: No     Allergies   Patient has no known allergies.   Review of Systems Review of Systems  Genitourinary: Positive for difficulty urinating.  All other systems reviewed and are negative.    Physical Exam Updated Vital Signs BP (!) 175/98 (BP Location: Right Arm)   Pulse  84   Temp 98.9 F (37.2 C) (Oral)   Resp 16   Ht 5\' 7"  (1.702 m)   Wt 66.2 kg (146 lb)   SpO2 100%   BMI 22.87 kg/m   Physical Exam  Constitutional: He is oriented to person, place, and time. He appears well-developed and well-nourished.  HENT:  Head: Normocephalic and atraumatic.  Mouth/Throat: Oropharynx is clear and moist.  Eyes: Pupils are equal, round, and reactive to light. Conjunctivae and EOM are normal.  Neck: Normal range of motion.  Cardiovascular: Normal rate, regular rhythm and normal heart sounds.  Pulmonary/Chest: Effort normal and breath sounds normal. No stridor. No respiratory distress.  Abdominal: Soft. Bowel sounds are normal. There is no tenderness. There is no rebound.  Musculoskeletal: Normal range of motion.  Neurological: He is alert and oriented to person, place, and time.    Skin: Skin is warm and dry.  Psychiatric: He has a normal mood and affect.  Nursing note and vitals reviewed.    ED Treatments / Results  Labs (all labs ordered are listed, but only abnormal results are displayed) Labs Reviewed  URINALYSIS, ROUTINE W REFLEX MICROSCOPIC - Abnormal; Notable for the following components:      Result Value   Color, Urine STRAW (*)    Hgb urine dipstick SMALL (*)    Bacteria, UA RARE (*)    Squamous Epithelial / LPF 0-5 (*)    All other components within normal limits  URINE CULTURE    EKG None  Radiology No results found.  Procedures Procedures (including critical care time)  Medications Ordered in ED Medications - No data to display   Initial Impression / Assessment and Plan / ED Course  I have reviewed the triage vital signs and the nursing notes.  Pertinent labs & imaging results that were available during my care of the patient were reviewed by me and considered in my medical decision making (see chart for details).  72 y.o. M here with urinary retention.  Not able to urinate for several hours.  Reports lots of pressure in his bladder. Has hx of BPH, took his flomax later than normal today.  Bedside bladder scan with >700cc retained.  Foley catheter inserted with immediately relief of symptoms.  UA without signs of infection, culture pending.  Will leave foley in place, have him follow-up closely with urology.  Continue flomax.  Discussed plan with patient, he/she acknowledged understanding and agreed with plan of care.  Return precautions given for new or worsening symptoms.  Final Clinical Impressions(s) / ED Diagnoses   Final diagnoses:  Urinary retention    ED Discharge Orders    None       Garlon HatchetSanders, Aquil Duhe M, PA-C 03/30/18 [id number]    Ward, Layla MawKristen N, DO 03/30/18 [phone]

## 2018-03-30 NOTE — Discharge Instructions (Signed)
Leave foley catheter in place. Follow-up with urology within the next 2-3 days-- call in the morning for appt. Return here for any new/acute changes-- no drainage from foley, bloody drainage, high fever, etc.

## 2018-03-31 LAB — URINE CULTURE: Culture: NO GROWTH

## 2018-05-05 ENCOUNTER — Other Ambulatory Visit: Payer: Self-pay

## 2018-05-05 ENCOUNTER — Emergency Department (HOSPITAL_COMMUNITY): Payer: Medicare Other

## 2018-05-05 ENCOUNTER — Emergency Department (HOSPITAL_COMMUNITY)
Admission: EM | Admit: 2018-05-05 | Discharge: 2018-05-05 | Disposition: A | Discharge status: Home / Self Care | Payer: Medicare Other | Attending: Emergency Medicine | Admitting: Emergency Medicine

## 2018-05-05 ENCOUNTER — Encounter (HOSPITAL_COMMUNITY): Payer: Self-pay | Admitting: *Deleted

## 2018-05-05 DIAGNOSIS — Y93H2 Activity, gardening and landscaping: Secondary | ICD-10-CM | POA: Insufficient documentation

## 2018-05-05 DIAGNOSIS — W57XXXA Bitten or stung by nonvenomous insect and other nonvenomous arthropods, initial encounter: Secondary | ICD-10-CM

## 2018-05-05 DIAGNOSIS — S20362A Insect bite (nonvenomous) of left front wall of thorax, initial encounter: Secondary | ICD-10-CM | POA: Diagnosis present

## 2018-05-05 DIAGNOSIS — Y999 Unspecified external cause status: Secondary | ICD-10-CM | POA: Diagnosis not present

## 2018-05-05 DIAGNOSIS — L03313 Cellulitis of chest wall: Secondary | ICD-10-CM | POA: Insufficient documentation

## 2018-05-05 DIAGNOSIS — L03319 Cellulitis of trunk, unspecified: Secondary | ICD-10-CM

## 2018-05-05 DIAGNOSIS — Y92007 Garden or yard of unspecified non-institutional (private) residence as the place of occurrence of the external cause: Secondary | ICD-10-CM | POA: Diagnosis not present

## 2018-05-05 DIAGNOSIS — Z7982 Long term (current) use of aspirin: Secondary | ICD-10-CM | POA: Insufficient documentation

## 2018-05-05 DIAGNOSIS — Z79899 Other long term (current) drug therapy: Secondary | ICD-10-CM | POA: Diagnosis not present

## 2018-05-05 LAB — COMPREHENSIVE METABOLIC PANEL
ALT: 18 U/L (ref 17–63)
AST: 23 U/L (ref 15–41)
Albumin: 4.3 g/dL (ref 3.5–5.0)
Alkaline Phosphatase: 58 U/L (ref 38–126)
Anion gap: 10 (ref 5–15)
BUN: 20 mg/dL (ref 6–20)
CALCIUM: 9 mg/dL (ref 8.9–10.3)
CHLORIDE: 105 mmol/L (ref 101–111)
CO2: 24 mmol/L (ref 22–32)
Creatinine, Ser: 1.05 mg/dL (ref 0.61–1.24)
GFR calc non Af Amer: >60 mL/min (ref >60)
Glucose, Bld: 101 mg/dL — ABNORMAL HIGH (ref 65–99)
Potassium: 3.9 mmol/L (ref 3.5–5.1)
SODIUM: 139 mmol/L (ref 135–145)
TOTAL PROTEIN: 6.9 g/dL (ref 6.5–8.1)
Total Bilirubin: 0.9 mg/dL (ref 0.3–1.2)

## 2018-05-05 LAB — CBC WITH DIFFERENTIAL/PLATELET
Abs Immature Granulocytes: 0 10*3/uL (ref 0.0–0.1)
BASOS ABS: 0.1 10*3/uL (ref 0.0–0.1)
BASOS PCT: 1 %
EOS PCT: 6 %
Eosinophils Absolute: 0.6 10*3/uL (ref 0.0–0.7)
HCT: 45.6 % (ref 39.0–52.0)
Hemoglobin: 14.8 g/dL (ref 13.0–17.0)
Immature Granulocytes: 0 %
Lymphocytes Relative: 17 %
Lymphs Abs: 1.8 10*3/uL (ref 0.7–4.0)
MCH: 27.1 pg (ref 26.0–34.0)
MCHC: 32.5 g/dL (ref 30.0–36.0)
MCV: 83.5 fL (ref 78.0–100.0)
MONO ABS: 0.9 10*3/uL (ref 0.1–1.0)
Monocytes Relative: 9 %
Neutro Abs: 7.4 10*3/uL (ref 1.7–7.7)
Neutrophils Relative %: 67 %
Platelets: 239 10*3/uL (ref 150–400)
RBC: 5.46 MIL/uL (ref 4.22–5.81)
RDW: 13.8 % (ref 11.5–15.5)
WBC: 10.8 10*3/uL — ABNORMAL HIGH (ref 4.0–10.5)

## 2018-05-05 LAB — I-STAT CG4 LACTIC ACID, ED: LACTIC ACID, VENOUS: 0.72 mmol/L (ref 0.5–1.9)

## 2018-05-05 MED ORDER — DOXYCYCLINE HYCLATE 100 MG PO TABS
100.0000 mg | ORAL_TABLET | Freq: Once | ORAL | Status: AC
  Administered 2018-05-05: 100 mg via ORAL
  Filled 2018-05-05: qty 1

## 2018-05-05 MED ORDER — DOXYCYCLINE HYCLATE 100 MG PO CAPS: 100.0000 mg | ORAL_CAPSULE | Freq: Two times a day (BID) | ORAL | 0 refills | Status: DC

## 2018-05-05 NOTE — ED Triage Notes (Signed)
Pt was weedeating this morning, noticed a tick to L collarbone this afternoon; attempted to remove tick when half the tick broke off into the skin. Pt then attempted to remove the rest of the tick with tweezers. Now has redness from L collar bone down into L axilla with increased swelling

## 2018-05-05 NOTE — ED Provider Notes (Signed)
[facility] EMERGENCY DEPARTMENT Provider Note   CSN: [id number] Arrival date & time: 05/05/18  0865     History   Chief Complaint Chief Complaint  Patient presents with  . Cellulitis    HPI Thomas Mcbride is a 72 y.o. male with a past medical history of BPH, ADHD, who presents to ED for evaluation of tick bite.  He was weed eating in his yard approximately 24 hours ago.  He noticed a small deer tick that embedded its entire body on his left collarbone when he got to work a few hours later.  He tried to remove the tick with tweezers.  Several hours later, he got an X-Acto knife and another pair of tweezers and was able to remove the tick from his body.  Since then he has had progressive worsening of redness, swelling, pruritis to the left collarbone extending down under his left axilla.  Reports tick bite in the past, several years ago but no prior history of this type of reaction.  He is not taking any medications to help with his symptoms.  He also reports possible tick bite on his right lower leg.  Denies any fevers, chest pain, shortness of breath, lip swelling, nausea, vomiting, headache, myalgias.  HPI  Past Medical History:  Diagnosis Date  . Allergy   . Attention deficit hyperactivity disorder (ADHD)   . BPH (benign prostatic hyperplasia)   . Depression     Patient Active Problem List   Diagnosis Date Noted  . Pain of left hand 11/18/2016  . Right hand pain 11/18/2016  . Chronic pain of right knee 11/18/2016  . Elevated cholesterol 06/18/2016  . BPH (benign prostatic hyperplasia) 12/05/2015  . Nonspecific abnormal electrocardiogram (ECG) (EKG) 09/02/2014  . Depression 06/22/2014    Past Surgical History:  Procedure Laterality Date  . FRACTURE SURGERY  1972   leg  . VASECTOMY          Home Medications    Prior to Admission medications   Medication Sig Start Date End Date Taking? Authorizing Provider  aspirin EC 81 MG tablet Take 81 mg by  mouth daily.    [provider]  atorvastatin (LIPITOR) 20 MG tablet Take 1 tablet (20 mg total) by mouth daily. 10/04/17   Shade Flood, MD  Coenzyme Q10 (CO Q-10) 100 MG CAPS Take 1 tablet by mouth daily.     [provider]  doxycycline (VIBRAMYCIN) 100 MG capsule Take 1 capsule (100 mg total) by mouth 2 (two) times daily for 5 days. 05/05/18 05/10/18  Vannie Hilgert, PA-C  GLUCOSAMINE-CHONDROITIN PO Take 1 tablet by mouth. occasionally when working out (4-6 monthly)    [provider]  L-TYROSINE PO Take 1 tablet by mouth daily.    [provider]  Multiple Vitamins-Minerals ([employer] SPECTRAVITE SENIOR PO) Take 1 tablet by mouth daily.    [provider]  tamsulosin (FLOMAX) 0.4 MG CAPS capsule Take 1 capsule (0.4 mg total) by mouth daily. 02/28/18   Shade Flood, MD  UNABLE TO FIND DLPA    [provider]    Family History Family History  Problem Relation Age of Onset  . Crohn's disease Mother   . Cancer Mother        brain  . Heart attack Father   . Lymphoma Father   . Cancer Father        marrow  . Heart disease Father   . Hyperlipidemia Father   . Hypertension  Father        ?  Marland Kitchen COPD Paternal Grandfather     Social History Social History   Tobacco Use  . Smoking status: Never Smoker  . Smokeless tobacco: Never Used  Substance Use Topics  . Alcohol use: No  . Drug use: No     Allergies   Patient has no known allergies.   Review of Systems Review of Systems  Constitutional: Negative for appetite change, chills and fever.  HENT: Negative for ear pain, rhinorrhea, sneezing and sore throat.   Eyes: Negative for photophobia and visual disturbance.  Respiratory: Negative for cough, chest tightness, shortness of breath and wheezing.   Cardiovascular: Negative for chest pain and palpitations.  Gastrointestinal: Negative for abdominal pain, blood in stool, constipation, diarrhea, nausea and vomiting.    Genitourinary: Negative for dysuria, hematuria and urgency.  Musculoskeletal: Negative for myalgias.  Skin: Positive for color change, rash and wound.  Neurological: Negative for dizziness, weakness and light-headedness.     Physical Exam Updated Vital Signs BP 133/76   Pulse (!) 51   Temp 98.2 F (36.8 C) (Oral)   Resp 19   SpO2 99%   Physical Exam  Constitutional: He appears well-developed and well-nourished. No distress.  HENT:  Head: Normocephalic and atraumatic.  Nose: Nose normal.  Eyes: Pupils are equal, round, and reactive to light. Conjunctivae and EOM are normal. Right eye exhibits no discharge. Left eye exhibits no discharge. No scleral icterus.  Neck: Normal range of motion. Neck supple.  Cardiovascular: Normal rate, regular rhythm, normal heart sounds and intact distal pulses. Exam reveals no gallop and no friction rub.  No murmur heard. Pulmonary/Chest: Effort normal and breath sounds normal. No respiratory distress.  Abdominal: Soft. Bowel sounds are normal. He exhibits no distension. There is no tenderness. There is no guarding.  Musculoskeletal: Normal range of motion. He exhibits no edema.  Neurological: He is alert. He exhibits normal muscle tone. Coordination normal.  Skin: Skin is warm and dry. No rash noted. There is erythema.  Small area of erythema on R lower leg. Erythema surrounding L collarbone extending towards the axilla as seen in the image. Mild edema noted to area.  Psychiatric: He has a normal mood and affect.  Nursing note and vitals reviewed.        ED Treatments / Results  Labs (all labs ordered are listed, but only abnormal results are displayed) Labs Reviewed  COMPREHENSIVE METABOLIC PANEL - Abnormal; Notable for the following components:      Result Value   Glucose, Bld 101 (*)    All other components within normal limits  CBC WITH DIFFERENTIAL/PLATELET - Abnormal; Notable for the following components:   WBC 10.8 (*)    All  other components within normal limits  I-STAT CG4 LACTIC ACID, ED    EKG None  Radiology Dg Chest 2 View  Result Date: 05/05/2018 CLINICAL DATA:  Edema and redness LEFT chest after tick removal. EXAM: CHEST - 2 VIEW COMPARISON:  Chest radiograph August 26, 2014 FINDINGS: Cardiomediastinal silhouette is normal. Mildly tortuous calcified aorta. No pleural effusions or focal consolidations. Trachea projects midline and there is no pneumothorax. Soft tissue planes and included osseous structures are non-suspicious. 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 Probable loose body LEFT shoulder. Mild degenerative change of thoracic spine. IMPRESSION: No active cardiopulmonary disease. Electronically Signed   By: Awilda Metro M.D.   On: 05/05/2018 03:14    Procedures Procedures (including critical care time)  Medications  Ordered in ED Medications  doxycycline (VIBRA-TABS) tablet 100 mg (100 mg Oral Given 05/05/18 1610)     Initial Impression / Assessment and Plan / ED Course  I have reviewed the triage vital signs and the nursing notes.  Pertinent labs & imaging results that were available during my care of the patient were reviewed by me and considered in my medical decision making (see chart for details).     Patient presents to ED for evaluation of tick bite.  He was weed eating in his yard yesterday morning when he noticed a small deer tick embedded onto the left collarbone.  Several hours later, he was able to remove the tick fully with an X-Acto knife and tweezers.  He has had progressive worsening of the redness and swelling as noted in the image above.  Denies any headache, vision changes, myalgias, coughing, chest pain, vomiting.  Denies prior history of this type of reaction to a tick bite.  On physical exam he is overall well-appearing.  He is afebrile.  No signs of angioedema or anaphylaxis.  Patient will need to be covered for tickborne illness with  doxycycline and possible local allergic type reaction to tick bite.  Will give doxycycline twice daily for 5 days and advised antihistamines as needed for itching.  Advised to return to ED for any severe worsening symptoms.  Portions of this note were generated with Scientist, clinical (histocompatibility and immunogenetics). Dictation errors may occur despite best attempts at proofreading.   Final Clinical Impressions(s) / ED Diagnoses   Final diagnoses:  Cellulitis of trunk, unspecified site of trunk  Insect bite of left front wall of thorax, initial encounter    ED Discharge Orders        Ordered    doxycycline (VIBRAMYCIN) 100 MG capsule  2 times daily     05/05/18 [address], PA-C 05/05/18 1011    Pricilla Loveless, MD 05/05/18 1610

## 2018-05-05 NOTE — Discharge Instructions (Signed)
Take Benadryl as needed for itching 

## 2018-05-10 ENCOUNTER — Encounter: Payer: Self-pay | Admitting: Family Medicine

## 2018-05-10 ENCOUNTER — Ambulatory Visit (INDEPENDENT_AMBULATORY_CARE_PROVIDER_SITE_OTHER): Payer: Medicare Other | Admitting: Family Medicine

## 2018-05-10 VITALS — BP 122/72 | HR 61 | Temp 98.1°F | Resp 17 | Ht 67.0 in | Wt 154.0 lb

## 2018-05-10 DIAGNOSIS — W57XXXD Bitten or stung by nonvenomous insect and other nonvenomous arthropods, subsequent encounter: Secondary | ICD-10-CM | POA: Diagnosis not present

## 2018-05-10 DIAGNOSIS — L03313 Cellulitis of chest wall: Secondary | ICD-10-CM

## 2018-05-10 MED ORDER — DOXYCYCLINE HYCLATE 100 MG PO CAPS: 100.0000 mg | ORAL_CAPSULE | Freq: Two times a day (BID) | ORAL | 0 refills | Status: AC

## 2018-05-10 NOTE — Progress Notes (Signed)
Subjective:  By signing my name below, I, Stann Ore, attest that this documentation has been prepared under the direction and in the presence of Meredith Staggers, MD. Electronically Signed: Stann Ore, Scribe. 05/10/2018 , 4:16 PM .  Patient was seen in Room 8 .   Patient ID: Thomas Mcbride, male    DOB: 1946/06/13, 72 y.o.   MRN: [id number] Chief Complaint  Patient presents with  . Follow-up    tick bite    HPI Thomas Mcbride is a 72 y.o. male Here for follow up of tick bite. He was seen for cellulitis 5 days ago at [facility] ER. He had removed a tick after working in his yard. He had removed it with tweezers and an Exacto knife. He also had a tick at his right leg in addition to collarbone. In the ER, he had spreading erythema towards the axilla. WBC 10.8 on CBC. He was treated with doxycycline  bid 5 day course, and advised antihistamines for itching. Did review ER notes.   He describes noticing more healing yesterday. He's been taking doxycycline for the past 5 days. Now, he reports areas vastly improved, with central area started healing yesterday. He's been applying iodine on the central area. He denies fever. He had a slight headache yesterday. He declines night sweats or chills. He denies side effects with doxycycline.   Patient Active Problem List   Diagnosis Date Noted  . Pain of left hand 11/18/2016  . Right hand pain 11/18/2016  . Chronic pain of right knee 11/18/2016  . Elevated cholesterol 06/18/2016  . BPH (benign prostatic hyperplasia) 12/05/2015  . Nonspecific abnormal electrocardiogram (ECG) (EKG) 09/02/2014  . Depression 06/22/2014   Past Medical History:  Diagnosis Date  . Allergy   . Attention deficit hyperactivity disorder (ADHD)   . BPH (benign prostatic hyperplasia)   . Depression    Past Surgical History:  Procedure Laterality Date  . FRACTURE SURGERY  1972   leg  . VASECTOMY     No Known Allergies Prior to Admission medications     Medication Sig Start Date End Date Taking? Authorizing Provider  aspirin EC 81 MG tablet Take 81 mg by mouth daily.    [provider]  atorvastatin (LIPITOR) 20 MG tablet Take 1 tablet (20 mg total) by mouth daily. 10/04/17   Shade Flood, MD  Coenzyme Q10 (CO Q-10) 100 MG CAPS Take 1 tablet by mouth daily.     [provider]  doxycycline (VIBRAMYCIN) 100 MG capsule Take 1 capsule (100 mg total) by mouth 2 (two) times daily for 5 days. 05/05/18 05/10/18  Khatri, Hina, PA-C  GLUCOSAMINE-CHONDROITIN PO Take 1 tablet by mouth. occasionally when working out (4-6 monthly)    [provider]  L-TYROSINE PO Take 1 tablet by mouth daily.    [provider]  Multiple Vitamins-Minerals ([employer] SPECTRAVITE SENIOR PO) Take 1 tablet by mouth daily.    [provider]  tamsulosin (FLOMAX) 0.4 MG CAPS capsule Take 1 capsule (0.4 mg total) by mouth daily. 02/28/18   Shade Flood, MD  UNABLE TO FIND DLPA    [provider]   Social History   Socioeconomic History  . Marital status: Divorced    Spouse name: Not on file  . Number of children: Not on file  . Years of education: Not on file  . Highest education level: Not on file  Occupational History  . Occupation: Sales executive: [employer]  Social Needs  . Financial resource strain: Not on file  . Food insecurity:    Worry: Not on file    Inability: Not on file  . Transportation needs:    Medical: Not on file    Non-medical: Not on file  Tobacco Use  . Smoking status: Never Smoker  . Smokeless tobacco: Never Used  Substance and Sexual Activity  . Alcohol use: No  . Drug use: No  . Sexual activity: Never    Birth control/protection: None  Lifestyle  . Physical activity:    Days per week: Not on file    Minutes per session: Not on file  . Stress: Not on file  Relationships  . Social connections:    Talks on phone: Not on file    Gets together: Not on file     Attends religious service: Not on file    Active member of club or organization: Not on file    Attends meetings of clubs or organizations: Not on file    Relationship status: Not on file  . Intimate partner violence:    Fear of current or ex partner: Not on file    Emotionally abused: Not on file    Physically abused: Not on file    Forced sexual activity: Not on file  Other Topics Concern  . Not on file  Social History Narrative   Exercises 3 x's week on eliptical machine.         Review of Systems  Constitutional: Negative for fatigue and unexpected weight change.  Eyes: Negative for visual disturbance.  Respiratory: Negative for cough, chest tightness and shortness of breath.   Cardiovascular: Negative for chest pain, palpitations and leg swelling.  Gastrointestinal: Negative for abdominal pain and blood in stool.  Skin: Negative for rash.       Insect bites (healing)  Neurological: Negative for dizziness, light-headedness and headaches.       Objective:   Physical Exam  Constitutional: He is oriented to person, place, and time. He appears well-developed and well-nourished. No distress.  HENT:  Head: Normocephalic and atraumatic.  Eyes: Pupils are equal, round, and reactive to light. EOM are normal.  Neck: Neck supple.  Cardiovascular: Normal rate.  Pulmonary/Chest: Effort normal. No respiratory distress.  Musculoskeletal: Normal range of motion.  Neurological: He is alert and oriented to person, place, and time.  Skin: Skin is warm and dry.  Chest wall: minimal erythema left upper chest, no erythema extending to the axilla, no appreciable axillary lymphadenopathy; appears healing wound on the left upper chest wall just above his clavicle with minimal induration, minimal fluctuance, induration measures approximately 1cm with a central wound of 3mm Right leg: healing wound 4mm right lower leg, no surrounding erythema, induration or fluctuance  Psychiatric: He has a normal  mood and affect. His behavior is normal.  Nursing note and vitals reviewed.   Vitals:   05/10/18 1542  BP: 122/72  Pulse: 61  Resp: 17  Temp: 98.1 F (36.7 C)  TempSrc: Oral  SpO2: 98%  Weight: 154 lb (69.9 kg)  Height:  (1.702 m)       Assessment & Plan:    Thomas Mcbride is a 72 y.o. male Cellulitis of chest wall - Plan: doxycycline (VIBRAMYCIN) 100 MG capsule  Tick bite, subsequent encounter - Plan: doxycycline (VIBRAMYCIN) 100 MG capsule   -Suspected cellulitis after initial tick bite and possible instrumentation.  Was improving with doxycycline, but reports significant erythema just faded yesterday.  We will continue doxycycline for an additional 5-day course for total 10 days of treatment.   No other apparent tickborne illness symptoms or signs at this time. Potential side effects discussed, RTC precautions discussed.  Meds ordered this encounter  Medications  . doxycycline (VIBRAMYCIN) 100 MG capsule    Sig: Take 1 capsule (100 mg total) by mouth 2 (two) times daily for 5 days.    Dispense:  10 capsule    Refill:  0   Patient Instructions   I would continue doxycycline for another 5 days for full 10 day course.  If any worsening redness, new rash, worsening headache, fevers, or other worsening symptoms, return right away.  Thank you for coming in today.   Cellulitis, Adult Cellulitis is a skin infection. The infected area is usually red and tender. This condition occurs most often in the arms and lower legs. The infection can travel to the muscles, blood, and underlying tissue and become serious. It is very important to get treated for this condition. What are the causes? Cellulitis is caused by bacteria. The bacteria enter through a break in the skin, such as a cut, burn, insect bite, open sore, or crack. What increases the risk? This condition is more likely to occur in people who:  Have a weak defense system (immune system).  Have open wounds on the skin  such as cuts, burns, bites, and scrapes. Bacteria can enter the body through these open wounds.  Are older.  Have diabetes.  Have a type of long-lasting (chronic) liver disease (cirrhosis) or kidney disease.  Use IV drugs.  What are the signs or symptoms? Symptoms of this condition include:  Redness, streaking, or spotting on the skin.  Swollen area of the skin.  Tenderness or pain when an area of the skin is touched.  Warm skin.  Fever.  Chills.  Blisters.  How is this diagnosed? This condition is diagnosed based on a medical history and physical exam. You may also have tests, including:  Blood tests.  Lab tests.  Imaging tests.  How is this treated? Treatment for this condition may include:  Medicines, such as antibiotic medicines or antihistamines.  Supportive care, such as rest and application of cold or warm cloths (cold or warm compresses) to the skin.  Hospital care, if the condition is severe.  The infection usually gets better within 1-2 days of treatment. Follow these instructions at home:  Take over-the-counter and prescription medicines only as told by your health care provider.  If you were prescribed an antibiotic medicine, take it as told by your health care provider. Do not stop taking the antibiotic even if you start to feel better.  Drink enough fluid to keep your urine clear or pale yellow.  Do not touch or rub the infected area.  Raise (elevate) the infected area above the level of your heart while you are sitting or lying down.  Apply warm or cold compresses to the affected area as told by your health care provider.  Keep all follow-up visits as told by your health care provider. This is important. These visits let your health care provider make sure a more serious infection is not developing. Contact a health care provider if:  You have a fever.  Your symptoms do not improve within 1-2 days of starting treatment.  Your bone or  joint underneath the infected area becomes painful after the skin has healed.  Your infection returns in the same area or another area.  You notice a swollen bump in the infected area.  You develop new symptoms.  You have a general ill feeling (malaise) with muscle aches and pains. Get help right away if:  Your symptoms get worse.  You feel very sleepy.  You develop vomiting or diarrhea that persists.  You notice red streaks coming from the infected area.  Your red area gets larger or turns dark in color. This information is not intended to replace advice given to you by your health care provider. Make sure you discuss any questions you have with your health care provider. Document Released: 09/09/2005 Document Revised: 04/09/2016 Document Reviewed: 10/09/2015 Elsevier Interactive Patient Education  2018 [employer].    Tick Bite Information, Adult Ticks are insects that can bite. Most ticks live in shrubs and grassy areas. They climb onto people and animals that go by. Then they bite. Some ticks carry germs that can make you sick. How can I prevent tick bites?  Use an insect repellent that has 20% or higher of the ingredients DEET, picaridin, or IR3535. Put this insect repellent on: ? Bare skin. ? The tops of your boots. ? Your pant legs. ? The ends of your sleeves.  If you use an insect repellent that has the ingredient permethrin, make sure to follow the instructions on the bottle. Treat the following: ? Clothing. ? Supplies. ? Boots. ? Tents.  Wear long sleeves, long pants, and light colors.  Tuck your pant legs into your socks.  Stay in the middle of the trail.  Try not to walk through long grass.  Before going inside your house, check your clothes, hair, and skin for ticks. Make sure to check your head, neck, armpits, waist, groin, and joint areas.  Check for ticks every day.  When you come indoors: ? Wash your clothes right away. ? Shower right  away. ? Dry your clothes in a dryer on high heat for 60 minutes or more. What is the right way to remove a tick? Remove a tick from your skin as soon as possible.  To remove a tick that is crawling on your skin: ? Go outdoors and brush the tick off. ? Use tape or a lint roller.  To remove a tick that is biting: ? Wash your hands. ? If you have latex gloves, put them on. ? Use tweezers, curved forceps, or a tick-removal tool to grasp the tick. Grasp the tick as close to your skin and as close to the tick's head as possible. ? Gently pull up until the tick lets go.  Try to keep the tick's head attached to its body.  Do not twist or jerk the tick.  Do not squeeze or crush the tick.  Do not try to remove a tick with heat, alcohol, petroleum jelly, or fingernail polish. How should I get rid of a tick? Here are some ways to get rid of a tick that is alive:  Place the tick in rubbing alcohol.  Place the tick in a bag or container you can close tightly.  Wrap the tick tightly in tape.  Flush the tick down the toilet.  Contact a doctor if:  You have symptoms of a disease, such as: ? Pain in a muscle, joint, or bone. ? Trouble walking or moving your legs. ? Numbness in your legs. ? Inability to move (paralysis). ? A red rash that makes a circle (bull's-eye rash). ? Redness and swelling where the tick bit you. ? A fever. ?  Throwing up (vomiting) over and over. ? Diarrhea. ? Weight loss. ? Tender and swollen lymph glands. ? Shortness of breath. ? Cough. ? Belly pain (abdominal pain). ? Headache. ? Being more tired than normal. ? A change in how alert (conscious) you are. ? Confusion. Get help right away if:  You cannot remove a tick.  A part of a tick breaks off and gets stuck in your skin.  You are feeling worse. Summary  Ticks may carry germs that can make you sick.  To prevent tick bites, wear long sleeves, long pants, and light colors. Use insect repellent.  Follow the instructions on the bottle.  If the tick is biting, do not try to remove it with heat, alcohol, petroleum jelly, or fingernail polish.  Use tweezers, curved forceps, or a tick-removal tool to grasp the tick. Gently pull up until the tick lets go. Do not twist or jerk the tick. Do not squeeze or crush the tick.  If you have symptoms, contact a doctor. This information is not intended to replace advice given to you by your health care provider. Make sure you discuss any questions you have with your health care provider. Document Released: 02/24/2010 Document Revised: 03/12/2017 Document Reviewed: 03/12/2017 Elsevier Interactive Patient Education  2018 [employer].    IF you received an x-ray today, you will receive an invoice from [facility]. Please contact [facility] at [phone] with questions or concerns regarding your invoice.   IF you received labwork today, you will receive an invoice from [place]. Please contact [facility] at [phone] with questions or concerns regarding your invoice.   Our billing staff will not be able to assist you with questions regarding bills from these companies.  You will be contacted with the lab results as soon as they are available. The fastest way to get your results is to activate your My Chart account. Instructions are located on the last page of this paperwork. If you have not heard from [place] regarding the results in 2 weeks, please contact this office.       I personally performed the services described in this documentation, which was scribed in my presence. The recorded information has been reviewed and considered for accuracy and completeness, addended by me as needed, and agree with information above.  Signed,   Meredith Staggers, MD Primary [facility] at [facility].  05/10/18 7:07 PM

## 2018-05-10 NOTE — Patient Instructions (Addendum)
I would continue doxycycline for another 5 days for full 10 day course.  If any worsening redness, new rash, worsening headache, fevers, or other worsening symptoms, return right away.  Thank you for coming in today.   Cellulitis, Adult Cellulitis is a skin infection. The infected area is usually red and tender. This condition occurs most often in the arms and lower legs. The infection can travel to the muscles, blood, and underlying tissue and become serious. It is very important to get treated for this condition. What are the causes? Cellulitis is caused by bacteria. The bacteria enter through a break in the skin, such as a cut, burn, insect bite, open sore, or crack. What increases the risk? This condition is more likely to occur in people who:  Have a weak defense system (immune system).  Have open wounds on the skin such as cuts, burns, bites, and scrapes. Bacteria can enter the body through these open wounds.  Are older.  Have diabetes.  Have a type of long-lasting (chronic) liver disease (cirrhosis) or kidney disease.  Use IV drugs.  What are the signs or symptoms? Symptoms of this condition include:  Redness, streaking, or spotting on the skin.  Swollen area of the skin.  Tenderness or pain when an area of the skin is touched.  Warm skin.  Fever.  Chills.  Blisters.  How is this diagnosed? This condition is diagnosed based on a medical history and physical exam. You may also have tests, including:  Blood tests.  Lab tests.  Imaging tests.  How is this treated? Treatment for this condition may include:  Medicines, such as antibiotic medicines or antihistamines.  Supportive care, such as rest and application of cold or warm cloths (cold or warm compresses) to the skin.  Hospital care, if the condition is severe.  The infection usually gets better within 1-2 days of treatment. Follow these instructions at home:  Take over-the-counter and prescription  medicines only as told by your health care provider.  If you were prescribed an antibiotic medicine, take it as told by your health care provider. Do not stop taking the antibiotic even if you start to feel better.  Drink enough fluid to keep your urine clear or pale yellow.  Do not touch or rub the infected area.  Raise (elevate) the infected area above the level of your heart while you are sitting or lying down.  Apply warm or cold compresses to the affected area as told by your health care provider.  Keep all follow-up visits as told by your health care provider. This is important. These visits let your health care provider make sure a more serious infection is not developing. Contact a health care provider if:  You have a fever.  Your symptoms do not improve within 1-2 days of starting treatment.  Your bone or joint underneath the infected area becomes painful after the skin has healed.  Your infection returns in the same area or another area.  You notice a swollen bump in the infected area.  You develop new symptoms.  You have a general ill feeling (malaise) with muscle aches and pains. Get help right away if:  Your symptoms get worse.  You feel very sleepy.  You develop vomiting or diarrhea that persists.  You notice red streaks coming from the infected area.  Your red area gets larger or turns dark in color. This information is not intended to replace advice given to you by your health care provider. Make  sure you discuss any questions you have with your health care provider. Document Released: 09/09/2005 Document Revised: 04/09/2016 Document Reviewed: 10/09/2015 Elsevier Interactive Patient Education  2018 [employer].    Tick Bite Information, Adult Ticks are insects that can bite. Most ticks live in shrubs and grassy areas. They climb onto people and animals that go by. Then they bite. Some ticks carry germs that can make you sick. How can I prevent tick  bites?  Use an insect repellent that has 20% or higher of the ingredients DEET, picaridin, or IR3535. Put this insect repellent on: ? Bare skin. ? The tops of your boots. ? Your pant legs. ? The ends of your sleeves.  If you use an insect repellent that has the ingredient permethrin, make sure to follow the instructions on the bottle. Treat the following: ? Clothing. ? Supplies. ? Boots. ? Tents.  Wear long sleeves, long pants, and light colors.  Tuck your pant legs into your socks.  Stay in the middle of the trail.  Try not to walk through long grass.  Before going inside your house, check your clothes, hair, and skin for ticks. Make sure to check your head, neck, armpits, waist, groin, and joint areas.  Check for ticks every day.  When you come indoors: ? Wash your clothes right away. ? Shower right away. ? Dry your clothes in a dryer on high heat for 60 minutes or more. What is the right way to remove a tick? Remove a tick from your skin as soon as possible.  To remove a tick that is crawling on your skin: ? Go outdoors and brush the tick off. ? Use tape or a lint roller.  To remove a tick that is biting: ? Wash your hands. ? If you have latex gloves, put them on. ? Use tweezers, curved forceps, or a tick-removal tool to grasp the tick. Grasp the tick as close to your skin and as close to the tick's head as possible. ? Gently pull up until the tick lets go.  Try to keep the tick's head attached to its body.  Do not twist or jerk the tick.  Do not squeeze or crush the tick.  Do not try to remove a tick with heat, alcohol, petroleum jelly, or fingernail polish. How should I get rid of a tick? Here are some ways to get rid of a tick that is alive:  Place the tick in rubbing alcohol.  Place the tick in a bag or container you can close tightly.  Wrap the tick tightly in tape.  Flush the tick down the toilet.  Contact a doctor if:  You have symptoms of a  disease, such as: ? Pain in a muscle, joint, or bone. ? Trouble walking or moving your legs. ? Numbness in your legs. ? Inability to move (paralysis). ? A red rash that makes a circle (bull's-eye rash). ? Redness and swelling where the tick bit you. ? A fever. ? Throwing up (vomiting) over and over. ? Diarrhea. ? Weight loss. ? Tender and swollen lymph glands. ? Shortness of breath. ? Cough. ? Belly pain (abdominal pain). ? Headache. ? Being more tired than normal. ? A change in how alert (conscious) you are. ? Confusion. Get help right away if:  You cannot remove a tick.  A part of a tick breaks off and gets stuck in your skin.  You are feeling worse. Summary  Ticks may carry germs that can make you  sick.  To prevent tick bites, wear long sleeves, long pants, and light colors. Use insect repellent. Follow the instructions on the bottle.  If the tick is biting, do not try to remove it with heat, alcohol, petroleum jelly, or fingernail polish.  Use tweezers, curved forceps, or a tick-removal tool to grasp the tick. Gently pull up until the tick lets go. Do not twist or jerk the tick. Do not squeeze or crush the tick.  If you have symptoms, contact a doctor. This information is not intended to replace advice given to you by your health care provider. Make sure you discuss any questions you have with your health care provider. Document Released: 02/24/2010 Document Revised: 03/12/2017 Document Reviewed: 03/12/2017 Elsevier Interactive Patient Education  2018 [employer].    IF you received an x-ray today, you will receive an invoice from [facility]. Please contact [facility] at [phone] with questions or concerns regarding your invoice.   IF you received labwork today, you will receive an invoice from [facility]. Please contact [facility] at [phone] with questions or concerns regarding your invoice.   Our billing staff will not be able to assist  you with questions regarding bills from these companies.  You will be contacted with the lab results as soon as they are available. The fastest way to get your results is to activate your My Chart account. Instructions are located on the last page of this paperwork. If you have not heard from [place] regarding the results in 2 weeks, please contact this office.

## 2018-06-02 ENCOUNTER — Telehealth: Payer: Self-pay | Admitting: Family Medicine

## 2018-06-02 NOTE — Telephone Encounter (Signed)
Copied from CRM [id number]. Topic: Quick Communication - See Telephone Encounter >> Jun 02, 2018  9:51 AM Lorrine KinMcGee, Jasyah Theurer B, NT wrote: CRM for notification. See Telephone encounter for: 06/02/18. Patient calling and states that the tick bite that he was seen for a few weeks ago(05/10/18), did not completely heal and he thinks it is still infected. Would like to know if another round of Doxycycline could be sent to the pharmacy? CB#: [phone] [id number] [place] ([place]), [place] - [address]

## 2018-06-03 NOTE — Telephone Encounter (Signed)
Please inform patient he will need an appointment to be seen before we can prescribe more antibiotics .

## 2018-08-23 ENCOUNTER — Ambulatory Visit: Payer: Medicare Other | Admitting: Family Medicine

## 2018-09-12 ENCOUNTER — Telehealth: Payer: Self-pay | Admitting: Family Medicine

## 2018-09-12 DIAGNOSIS — N401 Enlarged prostate with lower urinary tract symptoms: Secondary | ICD-10-CM

## 2018-09-12 DIAGNOSIS — R3911 Hesitancy of micturition: Principal | ICD-10-CM

## 2018-09-12 NOTE — Telephone Encounter (Signed)
Please advise on refill. Pt last visit was for an acute issue.   Thanksg

## 2018-09-12 NOTE — Telephone Encounter (Signed)
Copied from CRM [id number]. Topic: General - Other >> Sep 12, 2018  1:13 PM Percival Spanish wrote: Pt is asking for a refill tamsulosin (FLOMAX) 0.4 MG CAPS capsule    [facility] [address]

## 2018-09-13 MED ORDER — TAMSULOSIN HCL 0.4 MG PO CAPS: 0.4000 mg | ORAL_CAPSULE | Freq: Every day | ORAL | 0 refills | Status: DC

## 2018-09-13 NOTE — Telephone Encounter (Signed)
Left message for patient

## 2018-09-13 NOTE — Telephone Encounter (Signed)
Refilled, but please follow up to discuss this med before it runs out.

## 2018-09-14 ENCOUNTER — Other Ambulatory Visit: Payer: Self-pay

## 2018-09-14 ENCOUNTER — Telehealth: Payer: Self-pay | Admitting: Family Medicine

## 2018-09-14 DIAGNOSIS — E785 Hyperlipidemia, unspecified: Secondary | ICD-10-CM

## 2018-09-14 MED ORDER — ATORVASTATIN CALCIUM 20 MG PO TABS: 20.0000 mg | ORAL_TABLET | Freq: Every day | ORAL | 0 refills | Status: DC

## 2018-09-14 NOTE — Telephone Encounter (Signed)
Flomax refilled yesterday Atorvastatin - sent in #30 with note to call for appt for annual exam  Sent to Scheduling to call pt

## 2018-09-14 NOTE — Telephone Encounter (Signed)
Copied from CRM [id number]. Topic: Quick Communication - Rx Refill/Question >> Sep 14, 2018  8:53 AM Burchel, Abbi R wrote: Medication: tamsulosin (FLOMAX) 0.4 MG CAPS capsule 90 day supply, atorvastatin (LIPITOR) 20 MG tablet  Preferred Pharmacy: [facility] [address] ([address]), [place] - [address] [place] ([place]) [place] [address] Phone: [phone] Fax: [phone]

## 2018-12-29 DIAGNOSIS — N400 Enlarged prostate without lower urinary tract symptoms: Secondary | ICD-10-CM | POA: Insufficient documentation

## 2019-04-06 ENCOUNTER — Ambulatory Visit: Payer: Self-pay | Admitting: Cardiology

## 2019-04-10 ENCOUNTER — Encounter: Payer: Self-pay | Admitting: Cardiology

## 2019-04-10 NOTE — Progress Notes (Signed)
Telephone visit note  Subjective:   Thomas Mcbride, male    DOB: 02/09/1946, 73 y.o.   MRN: [id number]   I connected with the patient on 04/11/19 by a telephone call and verified that I am speaking with the correct person using two identifiers.     I offered the patient a video enabled application for a virtual visit. Unfortunately, this could not be accomplished due to technical difficulties/lack of video enabled phone/computer. I discussed the limitations of evaluation and management by telemedicine and the availability of in person appointments. The patient expressed understanding and agreed to proceed.   This visit type was conducted due to national recommendations for restrictions regarding the COVID-19 Pandemic (e.g. social distancing).  This format is felt to be most appropriate for this patient at this time.  All issues noted in this document were discussed and addressed.  No physical exam was performed (except for noted visual exam findings with Tele health visits).  The patient has consented to conduct a Tele health visit and understands insurance will be billed.   Chief complaint:  Bradycardia   HPI  73 y.o. Caucsian male with referred for evaluation of bradycardia.  [facility] by Wallis BambergMario Mani, PA.  His heart rate was noted to be 50 bpm.  Patient denies any symptoms of presyncope or syncope.  He works part-time at [employer].  While his physical activity is currently limited during the pandemic, he denies any chest pain or shortness of breath symptoms.  Also denies any orthopnea, PND.  Patient was previously seen in our office 5 years ago.  He had undergone exercise nuclear stress test which was negative for ischemia.  Of note, his EKG back then also showed heart rate in 40s and 50s at rest.  His blood pressure at his PCP visit on 03/31/2019 was noted to be 161/86 mmHg.  He is not on any antihypertensive therapy  at this time.  He does take Flomax 0.4 mg for prostate hypertrophy.  He does not have a blood pressure monitor at home and does not check his blood pressure regularly.  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    Employer: [employer]  Social Needs   Financial resource strain: Not on file   Food insecurity:    Worry: Not on file    Inability: Not on file   Transportation needs:    Medical: Not on file    Non-medical: Not on file  Tobacco Use   Smoking status: Never Smoker   Smokeless tobacco: Never Used  Substance and Sexual Activity   Alcohol use: No   Drug use: No   Sexual activity: Never    Birth control/protection: None  Lifestyle   Physical activity:    Days per week: Not on file    Minutes per session: Not on file  Stress: Not on file  Relationships   Social connections:    Talks on phone: Not on file    Gets together: Not on file    Attends religious service: Not on file    Active member of club or organization: Not on file    Attends meetings of clubs or organizations: Not on file    Relationship status: Not on file   Intimate partner violence:    Fear of current or ex partner: Not on file    Emotionally abused: Not on file    Physically abused: Not on file    Forced sexual activity: Not on file  Other Topics Concern   Not on file  Social History Narrative   Exercises 3 x's week on eliptical machine.            Family History  Problem Relation Age of  Onset   Crohn's disease Mother    Cancer Mother        brain   Heart attack Father    Lymphoma Father    Cancer Father        marrow   Heart disease Father    Hyperlipidemia Father    Hypertension Father        ?   COPD Paternal Grandfather    Hypertension Sister       Current Outpatient Medications on File Prior to Visit  Medication Sig Dispense Refill   aspirin EC 81 MG tablet Take 81 mg by mouth daily.     atorvastatin (LIPITOR) 20 MG tablet Take 1 tablet (20 mg total) by mouth daily. 30 tablet 0   Coenzyme Q10 (CO Q-10) 100 MG CAPS Take 1 tablet by mouth daily.      GLUCOSAMINE-CHONDROITIN PO Take 1 tablet by mouth. occasionally when working out (4-6 monthly)     Multiple Vitamins-Minerals ([employer] SPECTRAVITE SENIOR PO) Take 1 tablet by mouth daily.     tamsulosin (FLOMAX) 0.4 MG CAPS capsule Take 1 capsule (0.4 mg total) by mouth daily. 90 capsule 0   No current facility-administered medications on file prior to visit.     Cardiovascular studies:  EKG 03/31/2019 (Red by PCP): Sinus rhythm 50 bpm. AVL TWI. No change compared to 2016  Exercise Myoview stress test 10/15/2014: 1. The resting electrocardiogram demonstrated normal sinus rhythm, normal resting conduction and no resting arrhythmias.  The stress electrocardiogram was normal.  There was 2 mm upsloping ST segment depression with peak exercise, back to baseline at less than 2 minutes into recovery. The patient performed treadmill exercise using a Bruce protocol, completing 8:34 minutes. The patient completed an estimated workload of 7.3 METS. The stress test was terminated because of fatigue. 2. Myocardial perfusion imaging is normal. Overall left ventricular systolic function was normal without regional wall motion abnormalities. The left ventricular ejection fraction was 68%.  Recent labs: None available.  Review of Systems  Constitution: Negative for decreased appetite, malaise/fatigue, weight gain  and weight loss.  HENT: Negative for congestion.   Eyes: Negative for visual disturbance.  Cardiovascular: Negative for chest pain, dyspnea on exertion, leg swelling, palpitations and syncope.  Respiratory: Negative for shortness of breath.   Endocrine: Negative for cold intolerance.  Hematologic/Lymphatic: Does not bruise/bleed easily.  Skin: Negative for itching and rash.  Musculoskeletal: Negative for myalgias.  Gastrointestinal: Negative for abdominal pain, nausea and vomiting.  Genitourinary: Negative for dysuria.  Neurological: Negative for dizziness and weakness.  Psychiatric/Behavioral: The patient is not nervous/anxious.   All other systems  reviewed and are negative.       Vitals:   03/31/19 0922  BP: (!) 161/86  Pulse: (!) 50   (Measured by the patient using a home BP monitor)  Physical Exam: Not performed, as this is a telephone visit      Assessment & Recommendations:  73 y/o [place] male with referred for evaluation of bradycardia.  Bradycardia: Likely physiological for the patient. No workup necessary.  Elevated blood pressure reading in office without diagnosis of hypertension: BP 161/86 mmHg on 04/17. No prior h/o hypertension.Discsused diet and lifestyle modification, including regular exercise and salt restriction. I have encouraged him to get a BP monitor. F/u visit in 4 weeks. If still hypertensive, will need to start antihypertensive therapy.  Primary prevention: Continue lipitor 20 mg. Lipid panel check in 1 month In absence of known ASCVD, okay to stop aspirin.     Elder Negus, MD [facility]. [place] Pager: [phone] Office: [phone] If no answer Cell [phone]

## 2019-04-11 ENCOUNTER — Encounter: Payer: Self-pay | Admitting: Cardiology

## 2019-04-11 ENCOUNTER — Other Ambulatory Visit: Payer: Self-pay

## 2019-04-11 ENCOUNTER — Ambulatory Visit (INDEPENDENT_AMBULATORY_CARE_PROVIDER_SITE_OTHER): Payer: Medicare HMO | Admitting: Cardiology

## 2019-04-11 VITALS — BP 161/86 | HR 50 | Ht 67.0 in | Wt 154.0 lb

## 2019-04-11 DIAGNOSIS — R001 Bradycardia, unspecified: Secondary | ICD-10-CM | POA: Diagnosis not present

## 2019-04-11 DIAGNOSIS — R030 Elevated blood-pressure reading, without diagnosis of hypertension: Secondary | ICD-10-CM | POA: Diagnosis not present

## 2019-04-11 DIAGNOSIS — E782 Mixed hyperlipidemia: Secondary | ICD-10-CM | POA: Diagnosis not present

## 2019-08-20 ENCOUNTER — Encounter (HOSPITAL_COMMUNITY): Payer: Self-pay | Admitting: Emergency Medicine

## 2019-08-20 ENCOUNTER — Emergency Department (HOSPITAL_COMMUNITY)
Admission: EM | Admit: 2019-08-20 | Discharge: 2019-08-20 | Disposition: A | Discharge status: Home / Self Care | Payer: Medicare HMO | Attending: Emergency Medicine | Admitting: Emergency Medicine

## 2019-08-20 ENCOUNTER — Other Ambulatory Visit: Payer: Self-pay

## 2019-08-20 DIAGNOSIS — Y939 Activity, unspecified: Secondary | ICD-10-CM | POA: Diagnosis not present

## 2019-08-20 DIAGNOSIS — Y929 Unspecified place or not applicable: Secondary | ICD-10-CM | POA: Diagnosis not present

## 2019-08-20 DIAGNOSIS — W260XXA Contact with knife, initial encounter: Secondary | ICD-10-CM | POA: Diagnosis not present

## 2019-08-20 DIAGNOSIS — Z7982 Long term (current) use of aspirin: Secondary | ICD-10-CM | POA: Insufficient documentation

## 2019-08-20 DIAGNOSIS — S51811A Laceration without foreign body of right forearm, initial encounter: Secondary | ICD-10-CM | POA: Diagnosis not present

## 2019-08-20 DIAGNOSIS — Y999 Unspecified external cause status: Secondary | ICD-10-CM | POA: Insufficient documentation

## 2019-08-20 DIAGNOSIS — F909 Attention-deficit hyperactivity disorder, unspecified type: Secondary | ICD-10-CM | POA: Insufficient documentation

## 2019-08-20 DIAGNOSIS — Z79899 Other long term (current) drug therapy: Secondary | ICD-10-CM | POA: Insufficient documentation

## 2019-08-20 MED ORDER — BACITRACIN ZINC 500 UNIT/GM EX OINT: TOPICAL_OINTMENT | Freq: Once | CUTANEOUS | Status: DC

## 2019-08-20 MED ORDER — LIDOCAINE-EPINEPHRINE (PF) 2 %-1:200000 IJ SOLN
10.0000 mL | Freq: Once | INTRAMUSCULAR | Status: DC
  Filled 2019-08-20: qty 20

## 2019-08-20 NOTE — ED Provider Notes (Signed)
[facility]   CSN: [id number] Arrival date & time: 08/20/19  1847     History   Chief Complaint Chief Complaint  Patient presents with  . Extremity Laceration    HPI Thomas Mcbride is a 73 y.o. male.     The history is provided by the patient and medical records. No language interpreter was used.   Thomas Mcbride is a 73 y.o. male  with a PMH as listed below who presents to the Emergency Department complaining of laceration to the right forearm occurred just prior to arrival.  Patient states that he just bought a new razor blade to get paint off of the surface and was about to use it for the first time.  He got distracted by noise from the neighbors and turned quickly, accidentally cutting his right forearm with a razor blade which he is holding in the opposite hand.  No numbness or weakness.  No difficulty with movement.  Tetanus is up-to-date.  Past Medical History:  Diagnosis Date  . Allergy   . Attention deficit hyperactivity disorder (ADHD)   . BPH (benign prostatic hyperplasia)   . Depression   . Hyperlipidemia     Patient Active Problem List   Diagnosis Date Noted  . Bradycardia 04/11/2019  . Elevated blood pressure reading in office without diagnosis of hypertension 04/11/2019  . Pain of left hand 11/18/2016  . Right hand pain 11/18/2016  . Chronic pain of right knee 11/18/2016  . Elevated cholesterol 06/18/2016  . BPH (benign prostatic hyperplasia) 12/05/2015  . Nonspecific abnormal electrocardiogram (ECG) (EKG) 09/02/2014  . Depression 06/22/2014  . Mixed hyperlipidemia 05/02/2013    Past Surgical History:  Procedure Laterality Date  . FRACTURE SURGERY  1972   leg  . VASECTOMY          Home Medications    Prior to Admission medications   Medication Sig Start Date End Date Taking? Authorizing Provider  aspirin EC 81 MG tablet Take 81 mg by mouth daily. Every 3 days    [provider]   atorvastatin (LIPITOR) 20 MG tablet Take 1 tablet (20 mg total) by mouth daily. 09/14/18   Shade FloodGreene, Jeffrey R, MD  Coenzyme Q10 (CO Q-10) 100 MG CAPS Take 1 tablet by mouth daily. 3 times a week    [provider]  GLUCOSAMINE-CHONDROITIN PO Take 1 tablet by mouth. occasionally when working out (4-6 monthly)    [provider]  Krill Oil 1000 MG CAPS Take by mouth.    [provider]  tamsulosin (FLOMAX) 0.4 MG CAPS capsule Take 1 capsule (0.4 mg total) by mouth daily. 09/13/18   Shade FloodGreene, Jeffrey R, MD    Family History Family History  Problem Relation Age of Onset  . Crohn's disease Mother   . Cancer Mother        brain  . Heart attack Father   . Lymphoma Father   . Cancer Father        marrow  . Heart disease Father   . Hyperlipidemia Father   . Hypertension Father        ?  Marland Kitchen. COPD Paternal Grandfather   . Hypertension Sister     Social History Social History   Tobacco Use  . Smoking status: Never Smoker  . Smokeless tobacco: Never Used  Substance Use Topics  . Alcohol use: No  . Drug use: No     Allergies   Patient has no known allergies.  Review of Systems Review of Systems  Musculoskeletal: Positive for myalgias. Negative for arthralgias.  Skin: Positive for wound.  Neurological: Negative for weakness and numbness.     Physical Exam Updated Vital Signs BP (!) 172/76 (BP Location: Left Arm)   Pulse (!) 58   Temp 98.4 F (36.9 C) (Oral)   Resp 16   Ht 5\' 7"  (1.702 m)   Wt 67.6 kg   SpO2 100%   BMI 23.34 kg/m   Physical Exam Vitals signs and nursing note reviewed.  Constitutional:      General: He is not in acute distress.    Appearance: He is well-developed.  HENT:     Head: Normocephalic and atraumatic.  Neck:     Musculoskeletal: Neck supple.  Cardiovascular:     Rate and Rhythm: Normal rate and regular rhythm.     Heart sounds: Normal heart sounds. No murmur.  Pulmonary:     Effort: Pulmonary effort is normal.  No respiratory distress.     Breath sounds: Normal breath sounds. No wheezing or rales.  Musculoskeletal:     Comments: Right upper extremity with 2+ radial pulse and sensation intact to median, ulnar and radial nerve distributions.  Full strength including grip strength and pincer grasp.  Able to hold thumb up against resistance.  Full range of motion and able to cross fingers.   Skin:    General: Skin is warm and dry.     Comments: Approximately 5 cm linear laceration to the right forearm.  Neurological:     Mental Status: He is alert.      ED Treatments / Results  Labs (all labs ordered are listed, but only abnormal results are displayed) Labs Reviewed - No data to display  EKG None  Radiology No results found.  Procedures .Marland KitchenLaceration Repair  Date/Time: 08/20/2019 10:45 PM Performed by: Snyder Colavito, Ozella Almond, PA-C Authorized by: Samarrah Tranchina, Ozella Almond, PA-C   Consent:    Consent obtained:  Verbal   Consent given by:  Patient   Risks discussed:  Pain, infection, poor cosmetic result and poor wound healing Anesthesia (see MAR for exact dosages):    Anesthesia method:  Local infiltration   Local anesthetic:  Lidocaine 2% WITH epi Laceration details:    Location:  Shoulder/arm   Shoulder/arm location:  R lower arm   Length (cm):  5 Repair type:    Repair type:  Simple Pre-procedure details:    Preparation:  Patient was prepped and draped in usual sterile fashion Exploration:    Hemostasis achieved with:  Direct pressure   Wound exploration: wound explored through full range of motion and entire depth of wound probed and visualized     Wound extent: no foreign bodies/material noted, no muscle damage noted and no tendon damage noted   Treatment:    Area cleansed with:  Saline and Betadine   Amount of cleaning:  Standard   Irrigation solution:  Sterile saline   Irrigation volume:  1L Skin repair:    Repair method:  Sutures   Suture size:  4-0   Suture material:   Prolene   Suture technique:  Simple interrupted   Number of sutures:  9 Approximation:    Approximation:  Close Post-procedure details:    Dressing:  Antibiotic ointment   Patient tolerance of procedure:  Tolerated well, no immediate complications   (including critical care time)  Medications Ordered in ED Medications  lidocaine-EPINEPHrine (XYLOCAINE W/EPI) 2 %-1:200000 (PF) injection 10 mL (has no administration  in time range)  bacitracin ointment (has no administration in time range)     Initial Impression / Assessment and Plan / ED Course  I have reviewed the triage vital signs and the nursing notes.  Pertinent labs & imaging results that were available during my care of the patient were reviewed by me and considered in my medical decision making (see chart for details).       Zakariya Oherron is a 73 y.o. male who presents to ED for laceration of right forearm. NVI with full ROM and strength. Wound thoroughly cleaned in ED today. Wound explored and bottom of wound seen in a bloodless field. Laceration repaired as dictated above. Patient counseled on home wound care. Follow up with PCP/urgent care or return to ER for suture removal in 10-14 days. Patient was urged to return to the Emergency Department for worsening pain, swelling, expanding erythema especially if it streaks away from the affected area, fever, or for any additional concerns. Patient verbalized understanding. All questions answered.   Final Clinical Impressions(s) / ED Diagnoses   Final diagnoses:  Laceration of right forearm, initial encounter    ED Discharge Orders    None       Imogen Maddalena, Chase Picket, PA-C 08/20/19 2246    Charlynne Pander, MD 08/23/19 [phone]

## 2019-08-20 NOTE — ED Notes (Signed)
Patient Alert and oriented to baseline. Stable and ambulatory to baseline. Patient verbalized understanding of the discharge instructions.  Patient belongings were taken by the patient.   

## 2019-08-20 NOTE — Discharge Instructions (Signed)
It was my pleasure taking care of you today!  ° °Keep wound clean with mild soap and water. Keep area covered with a topical antibiotic ointment and bandage, keep bandage dry, and do not submerge in water for 24 hours. Ice and elevate for additional pain relief and swelling. Alternate between ibuprofen and Tylenol for additional pain relief. Follow up with your primary care doctor, [facility] or ER in approximately 10-14 days for wound recheck and suture removal. Monitor area for signs of infection to include, but not limited to: increasing pain, spreading redness, drainage/pus, worsening swelling, or fevers. Return to emergency department for emergent changing or worsening symptoms. ° ° °WOUND CARE °Keep area clean and dry for 24 hours. Do not remove bandage, if applied. °After 24 hours,you should change it at least once a day. Also, change the dressing if it becomes wet or dirty, or as directed by your caregiver.  °Wash the wound with soap and water 2 times a day. Rinse the wound off with water to remove all soap. Pat the wound dry with a clean towel.  °You may shower as usual after the first 24 hours. Do not soak the wound in water until the sutures are removed.  °Return if you experience any of the following signs of infection: Swelling, redness, pus drainage, streaking, fever >100.4 F °Return if you experience excessive bleeding that does not stop after 15-20 minutes of constant, firm pressure. ° ° °

## 2019-08-20 NOTE — ED Triage Notes (Addendum)
C/o approx 3 inch laceration to R forearm from a "clean" razor blade window scraper. Pressure bandage applied PTA.  DT within 5 years.  CMS intact.  New bandage applied on arrival.  Bleeding controlled.

## 2019-09-10 IMAGING — CR DG CHEST 2V
2 series · 2 of 2 positions shown · non-contrast
Comparison: Chest radiograph August 26, 2014

CLINICAL DATA: Edema and redness LEFT chest after tick removal.

EXAM:
CHEST - 2 VIEW

[chest pa]
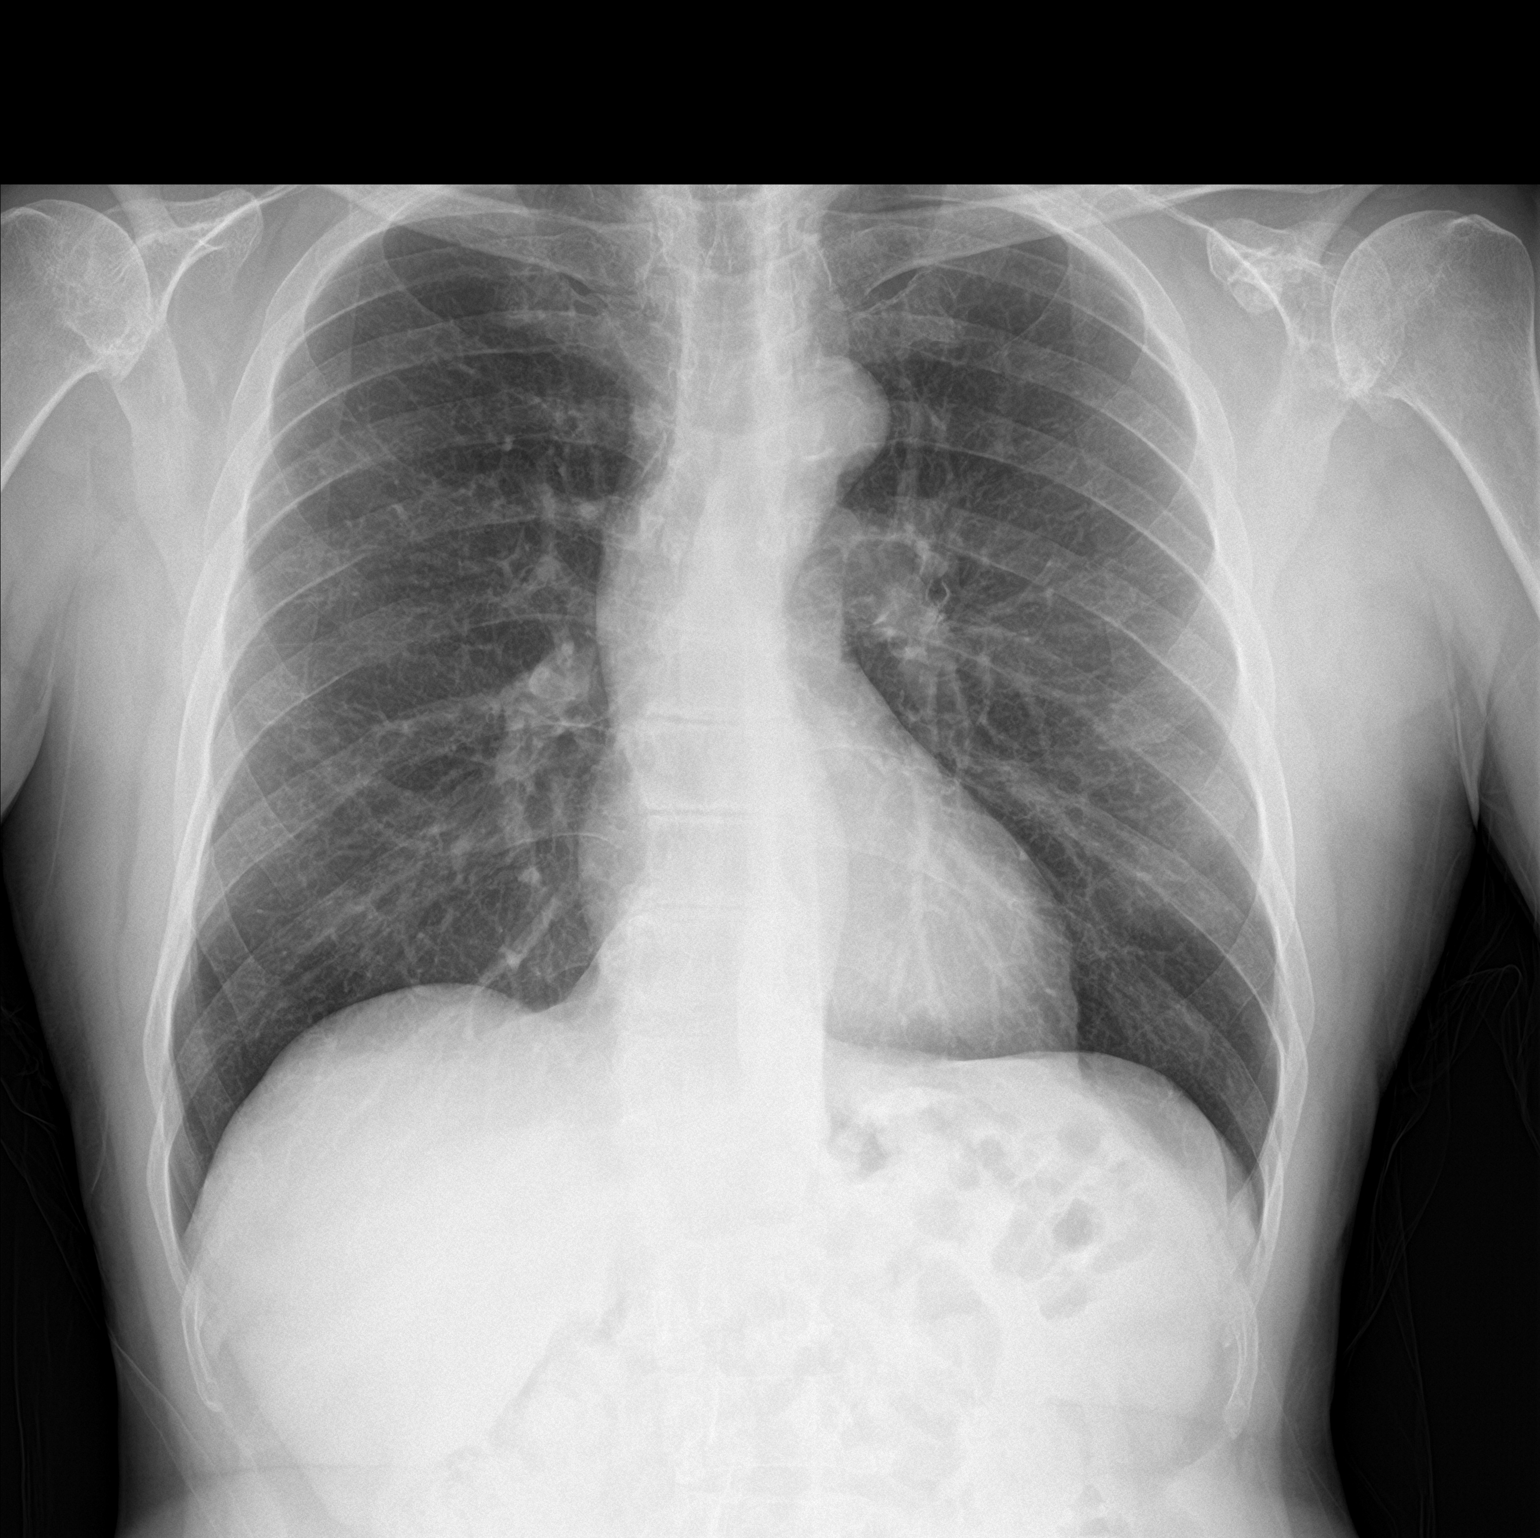

[chest lat]
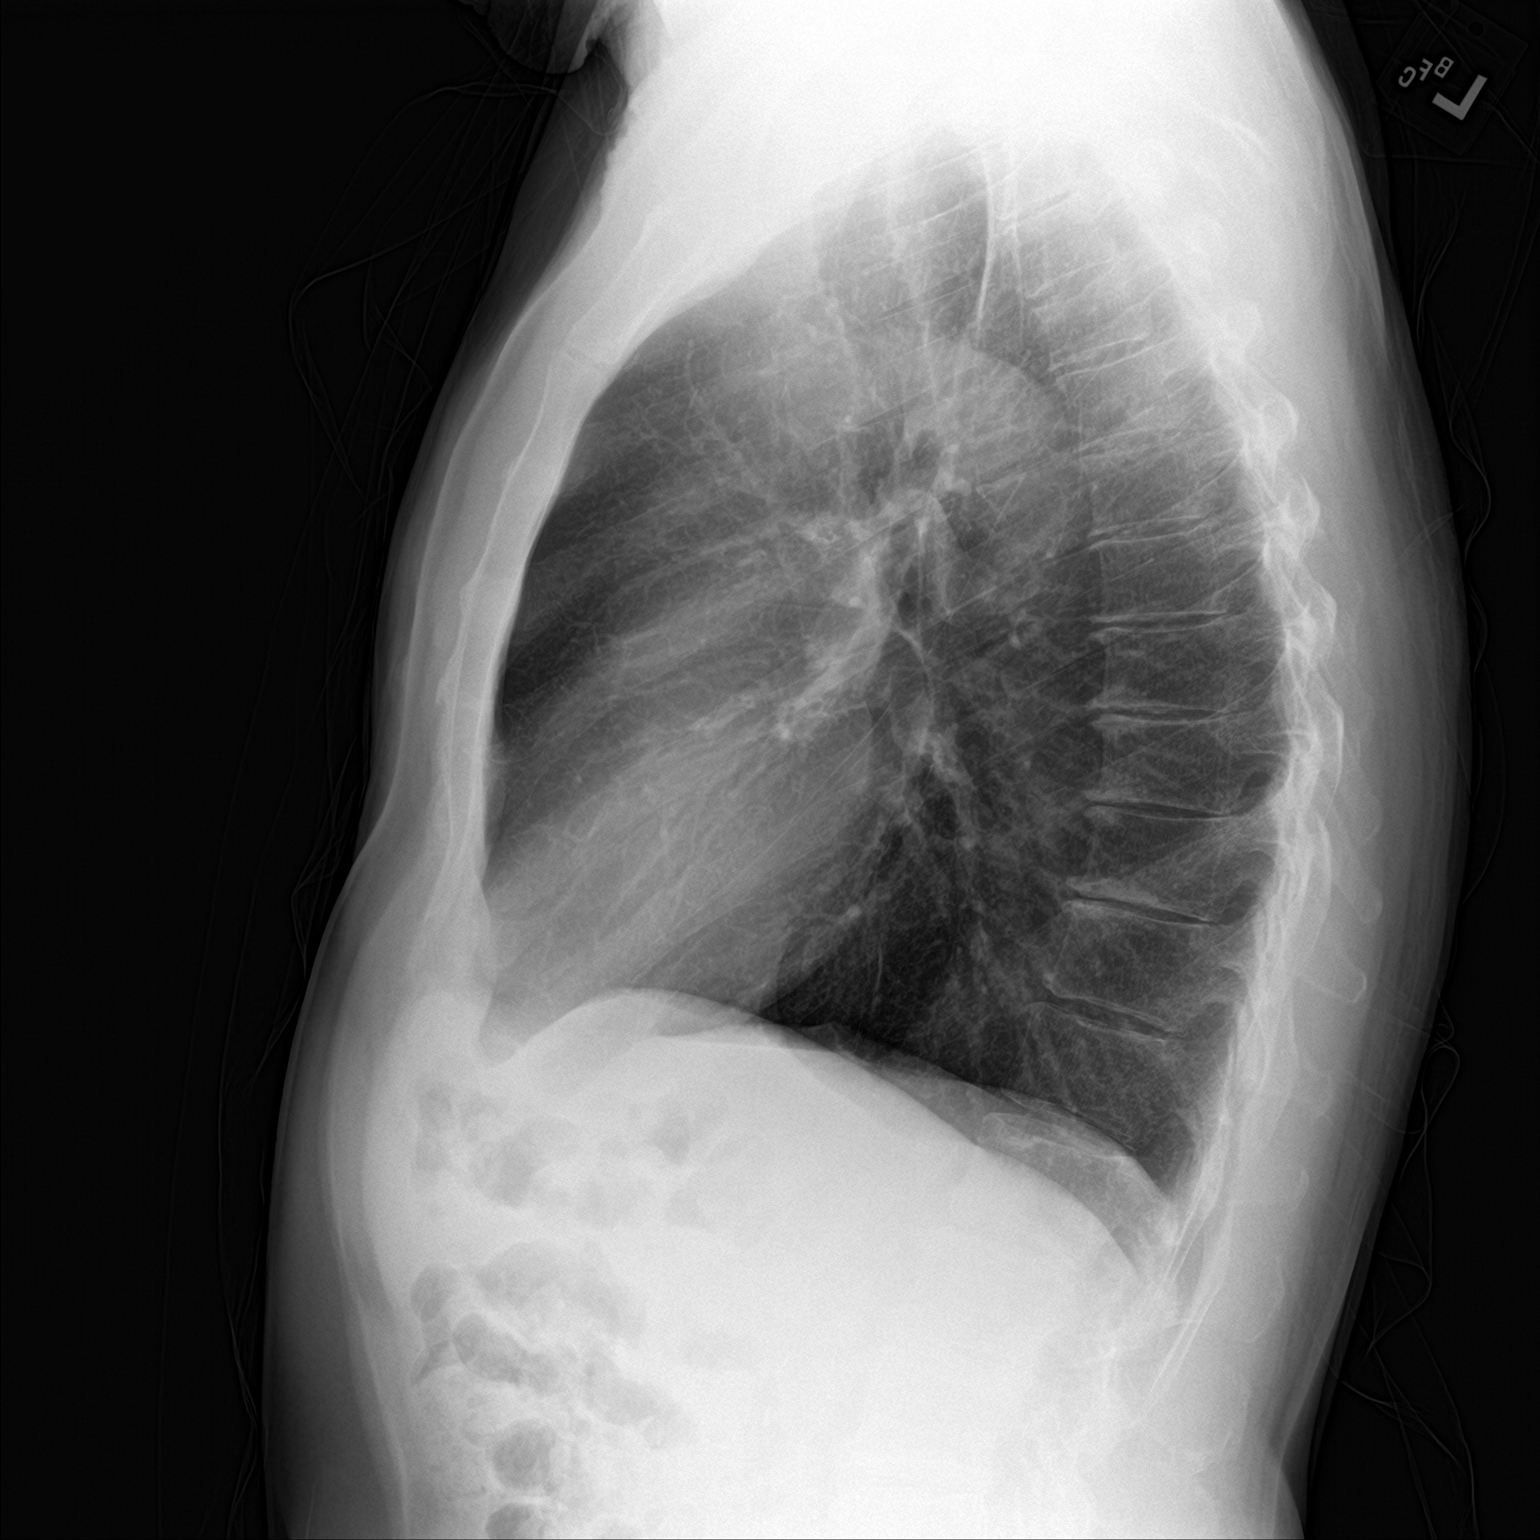

[2 of 2 positions shown; findings below may reference images not displayed]

FINDINGS: Cardiomediastinal silhouette is normal. Mildly tortuous calcified
aorta. No pleural effusions or focal consolidations. Trachea
projects midline and there is no pneumothorax. Soft tissue planes
and included osseous structures are non-suspicious. Advanced LEFT
greater than RIGHT glenohumeral osteoarthrosis. Probable loose body
LEFT shoulder. Mild degenerative change of thoracic spine.
IMPRESSION: No active cardiopulmonary disease.

## 2019-10-19 DIAGNOSIS — N400 Enlarged prostate without lower urinary tract symptoms: Secondary | ICD-10-CM | POA: Insufficient documentation

## 2019-10-19 DIAGNOSIS — M25561 Pain in right knee: Secondary | ICD-10-CM | POA: Insufficient documentation

## 2019-10-19 DIAGNOSIS — M1711 Unilateral primary osteoarthritis, right knee: Secondary | ICD-10-CM | POA: Insufficient documentation

## 2019-10-19 DIAGNOSIS — I10 Essential (primary) hypertension: Secondary | ICD-10-CM | POA: Insufficient documentation

## 2019-12-27 DIAGNOSIS — I8312 Varicose veins of left lower extremity with inflammation: Secondary | ICD-10-CM | POA: Diagnosis not present

## 2019-12-27 DIAGNOSIS — I8311 Varicose veins of right lower extremity with inflammation: Secondary | ICD-10-CM | POA: Diagnosis not present

## 2020-01-25 ENCOUNTER — Telehealth: Payer: Self-pay

## 2020-01-25 NOTE — Telephone Encounter (Signed)
Called patient to do their pre-visit COVID screening.  Call went to voicemail. Unable to do prescreening.  

## 2020-01-26 ENCOUNTER — Ambulatory Visit: Payer: Medicare HMO | Admitting: Internal Medicine

## 2021-08-25 ENCOUNTER — Other Ambulatory Visit (HOSPITAL_BASED_OUTPATIENT_CLINIC_OR_DEPARTMENT_OTHER): Payer: Self-pay

## 2021-08-25 ENCOUNTER — Ambulatory Visit: Payer: Self-pay

## 2021-09-09 ENCOUNTER — Ambulatory Visit: Payer: Medicare Other | Admitting: Physician Assistant

## 2021-09-09 ENCOUNTER — Other Ambulatory Visit: Payer: Self-pay

## 2021-09-09 VITALS — BP 132/76 | HR 51 | Temp 98.9°F | Resp 20 | Ht 67.0 in | Wt 145.0 lb

## 2021-09-09 DIAGNOSIS — R3911 Hesitancy of micturition: Secondary | ICD-10-CM

## 2021-09-09 DIAGNOSIS — N401 Enlarged prostate with lower urinary tract symptoms: Secondary | ICD-10-CM

## 2021-09-09 DIAGNOSIS — E785 Hyperlipidemia, unspecified: Secondary | ICD-10-CM | POA: Insufficient documentation

## 2021-09-09 DIAGNOSIS — Z6822 Body mass index (BMI) 22.0-22.9, adult: Secondary | ICD-10-CM

## 2021-09-09 MED ORDER — ATORVASTATIN CALCIUM 20 MG PO TABS: 20.0000 mg | ORAL_TABLET | Freq: Every day | ORAL | 1 refills | Status: DC

## 2021-09-09 MED ORDER — TAMSULOSIN HCL 0.4 MG PO CAPS: 0.4000 mg | ORAL_CAPSULE | Freq: Every day | ORAL | 1 refills | Status: DC

## 2021-09-09 NOTE — Patient Instructions (Signed)
I have refilled your medications for you.  We will call you with today's lab results.  It is very important that you keep your appointment with Dr. Andrey Campanile to establish care.  Please let us know if there is anything else we can do for you.  Roney Jaffe, PA-C Physician Assistant [facility] [email or web address]   Health Maintenance, Male Adopting a healthy lifestyle and getting preventive care are important in promoting health and wellness. Ask your health care provider about: The right schedule for you to have regular tests and exams. Things you can do on your own to prevent diseases and keep yourself healthy. What should I know about diet, weight, and exercise? Eat a healthy diet  Eat a diet that includes plenty of vegetables, fruits, low-fat dairy products, and lean protein. Do not eat a lot of foods that are high in solid fats, added sugars, or sodium. Maintain a healthy weight Body mass index (BMI) is a measurement that can be used to identify possible weight problems. It estimates body fat based on height and weight. Your health care provider can help determine your BMI and help you achieve or maintain a healthy weight. Get regular exercise Get regular exercise. This is one of the most important things you can do for your health. Most adults should: Exercise for at least 150 minutes each week. The exercise should increase your heart rate and make you sweat (moderate-intensity exercise). Do strengthening exercises at least twice a week. This is in addition to the moderate-intensity exercise. Spend less time sitting. Even light physical activity can be beneficial. Watch cholesterol and blood lipids Have your blood tested for lipids and cholesterol at 73 years of age, then have this test every 5 years. You may need to have your cholesterol levels checked more often if: Your lipid or cholesterol levels are high. You are older than  73 years of age. You are at high risk for heart disease. What should I know about cancer screening? Many types of cancers can be detected early and may often be prevented. Depending on your health history and family history, you may need to have cancer screening at various ages. This may include screening for: Colorectal cancer. Prostate cancer. Skin cancer. Lung cancer. What should I know about heart disease, diabetes, and high blood pressure? Blood pressure and heart disease High blood pressure causes heart disease and increases the risk of stroke. This is more likely to develop in people who have high blood pressure readings, are of African descent, or are overweight. Talk with your health care provider about your target blood pressure readings. Have your blood pressure checked: Every 3-5 years if you are 46-5 years of age. Every year if you are 40 years old or older. If you are between the ages of 5 and 21 and are a current or former smoker, ask your health care provider if you should have a one-time screening for abdominal aortic aneurysm (AAA). Diabetes Have regular diabetes screenings. This checks your fasting blood sugar level. Have the screening done: Once every three years after age 17 if you are at a normal weight and have a low risk for diabetes. More often and at a younger age if you are overweight or have a high risk for diabetes. What should I know about preventing infection? Hepatitis B If you have a higher risk for hepatitis B, you should be screened for this virus. Talk with your health care provider to find out if you are at  risk for hepatitis B infection. Hepatitis C Blood testing is recommended for: Everyone born from [birth year] through 1963. Anyone with known risk factors for hepatitis C. Sexually transmitted infections (STIs) You should be screened each year for STIs, including gonorrhea and chlamydia, if: You are sexually active and are younger than 73 years of  age. You are older than 73 years of age and your health care provider tells you that you are at risk for this type of infection. Your sexual activity has changed since you were last screened, and you are at increased risk for chlamydia or gonorrhea. Ask your health care provider if you are at risk. Ask your health care provider about whether you are at high risk for HIV. Your health care provider may recommend a prescription medicine to help prevent HIV infection. If you choose to take medicine to prevent HIV, you should first get tested for HIV. You should then be tested every 3 months for as long as you are taking the medicine. Follow these instructions at home: Lifestyle Do not use any products that contain nicotine or tobacco, such as cigarettes, e-cigarettes, and chewing tobacco. If you need help quitting, ask your health care provider. Do not use street drugs. Do not share needles. Ask your health care provider for help if you need support or information about quitting drugs. Alcohol use Do not drink alcohol if your health care provider tells you not to drink. If you drink alcohol: Limit how much you have to 0-2 drinks a day. Be aware of how much alcohol is in your drink. In the [place] one drink equals one 12 oz bottle of beer (355 mL), one 5 oz glass of wine (148 mL), or one 1 oz glass of hard liquor (44 mL). General instructions Schedule regular health, dental, and eye exams. Stay current with your vaccines. Tell your health care provider if: You often feel depressed. You have ever been abused or do not feel safe at home. Summary Adopting a healthy lifestyle and getting preventive care are important in promoting health and wellness. Follow your health care provider's instructions about healthy diet, exercising, and getting tested or screened for diseases. Follow your health care provider's instructions on monitoring your cholesterol and blood pressure. This information is not  intended to replace advice given to you by your health care provider. Make sure you discuss any questions you have with your health care provider. Document Revised: 02/07/2021 Document Reviewed: 11/23/2018 Elsevier Patient Education  2022 [employer].

## 2021-09-09 NOTE — Progress Notes (Signed)
Established Patient Office Visit  Subjective:  Patient ID: Thomas Mcbride, male    DOB: Aug 23, 1946  Age: 75 y.o. MRN: [id number]  CC:  Chief Complaint  Patient presents with   Medication Refill    HPI Thomas Mcbride presents for medication refills.  Reports that he has not been out of his medications.  Reports that he has an upcoming appointment to establish care at [facility] at [facility].  No other concerns at this time.  Sleep is good, appetite is good.  Past Medical History:  Diagnosis Date   Allergy    Attention deficit hyperactivity disorder (ADHD)    BPH (benign prostatic hyperplasia)    Depression    Hyperlipidemia     Past Surgical History:  Procedure Laterality Date   FRACTURE SURGERY  1972   leg   VASECTOMY      Family History  Problem Relation Age of Onset   Crohn's disease Mother    Cancer Mother        brain   Heart attack Father    Lymphoma Father    Cancer Father        marrow   Heart disease Father    Hyperlipidemia Father    Hypertension Father        ?   COPD Paternal Grandfather    Hypertension Sister     Social History   Socioeconomic History   Marital status: Divorced    Spouse name: Not on file   Number of children: 2   Years of education: Not on file   Highest education level: Not on file  Occupational History   Occupation: Quarry manager ASSOCIATE    Employer: [employer]  Tobacco Use   Smoking status: Never   Smokeless tobacco: Never  Vaping Use   Vaping Use: Never used  Substance and Sexual Activity   Alcohol use: No   Drug use: No   Sexual activity: Not Currently    Birth control/protection: None  Other Topics Concern   Not on file  Social History Narrative   Exercises 3 x's week on eliptical machine.         Social Determinants of Health   Financial Resource Strain: Not on file  Food Insecurity: Not on file  Transportation Needs: Not on file  Physical Activity: Not on file  Stress: Not on file  Social Connections:  Not on file  Intimate Partner Violence: Not on file    Outpatient Medications Prior to Visit  Medication Sig Dispense Refill   aspirin EC 81 MG tablet Take 81 mg by mouth daily. Every 3 days     Coenzyme Q10 (CO Q-10) 100 MG CAPS Take 1 tablet by mouth daily. 3 times a week     GLUCOSAMINE-CHONDROITIN PO Take 1 tablet by mouth. occasionally when working out (4-6 monthly)     Krill Oil 1000 MG CAPS Take by mouth.     atorvastatin (LIPITOR) 20 MG tablet Take 1 tablet (20 mg total) by mouth daily. 30 tablet 0   tamsulosin (FLOMAX) 0.4 MG CAPS capsule Take 1 capsule (0.4 mg total) by mouth daily. 90 capsule 0   No facility-administered medications prior to visit.    No Known Allergies  ROS Review of Systems  Constitutional: Negative.   HENT: Negative.    Eyes: Negative.   Respiratory:  Negative for shortness of breath.   Cardiovascular:  Negative for chest pain.  Gastrointestinal: Negative.   Endocrine: Negative.   Genitourinary: Negative.   Musculoskeletal:  Negative.   Skin: Negative.   Allergic/Immunologic: Negative.   Neurological: Negative.   Psychiatric/Behavioral: Negative.       Objective:    Physical Exam Vitals and nursing note reviewed.  Constitutional:      Appearance: Normal appearance.  HENT:     Head: Normocephalic and atraumatic.     Right Ear: External ear normal.     Left Ear: External ear normal.     Nose: Nose normal.     Mouth/Throat:     Mouth: Mucous membranes are moist.     Pharynx: Oropharynx is clear.  Eyes:     Extraocular Movements: Extraocular movements intact.     Conjunctiva/sclera: Conjunctivae normal.     Pupils: Pupils are equal, round, and reactive to light.  Cardiovascular:     Rate and Rhythm: Normal rate and regular rhythm.     Pulses: Normal pulses.     Heart sounds: Normal heart sounds.  Pulmonary:     Effort: Pulmonary effort is normal.     Breath sounds: Normal breath sounds.  Musculoskeletal:        General: Normal  range of motion.     Cervical back: Normal range of motion and neck supple.  Skin:    General: Skin is warm and dry.  Neurological:     General: No focal deficit present.     Mental Status: He is alert and oriented to person, place, and time.  Psychiatric:        Mood and Affect: Mood normal.        Behavior: Behavior normal.        Thought Content: Thought content normal.        Judgment: Judgment normal.    BP 132/76 (BP Location: Left Arm, Patient Position: Sitting, Cuff Size: Normal)   Pulse (!) 51   Temp 98.9 F (37.2 C) (Oral)   Resp 20   Ht 5\' 7"  (1.702 m)   Wt 145 lb (65.8 kg)   SpO2 97%   BMI 22.71 kg/m  Wt Readings from Last 3 Encounters:  09/09/21 145 lb (65.8 kg)  08/20/19 149 lb (67.6 kg)  04/11/19 154 lb (69.9 kg)     Health Maintenance Due  Topic Date Due   COVID-19 Vaccine (1) Never done   Zoster Vaccines- Shingrix (1 of 2) Never done   INFLUENZA VACCINE  07/14/2021    There are no preventive care reminders to display for this patient.  Lab Results  Component Value Date   TSH 3.587 12/05/2015   Lab Results  Component Value Date   WBC 10.8 (H) 05/05/2018   HGB 14.8 05/05/2018   HCT 45.6 05/05/2018   MCV 83.5 05/05/2018   PLT 239 05/05/2018   Lab Results  Component Value Date   NA 139 05/05/2018   K 3.9 05/05/2018   CO2 24 05/05/2018   GLUCOSE 101 (H) 05/05/2018   BUN 20 05/05/2018   CREATININE 1.05 05/05/2018   BILITOT 0.9 05/05/2018   ALKPHOS 58 05/05/2018   AST 23 05/05/2018   ALT 18 05/05/2018   PROT 6.9 05/05/2018   ALBUMIN 4.3 05/05/2018   CALCIUM 9.0 05/05/2018   ANIONGAP 10 05/05/2018   Lab Results  Component Value Date   CHOL 167 10/04/2017   Lab Results  Component Value Date   HDL 66 10/04/2017   Lab Results  Component Value Date   LDLCALC 84 10/04/2017   Lab Results  Component Value Date   TRIG 84 10/04/2017  Lab Results  Component Value Date   CHOLHDL 2.5 10/04/2017   No results found for: HGBA1C     Assessment & Plan:   Problem List Items Addressed This Visit       Genitourinary   BPH (benign prostatic hyperplasia)   Relevant Medications   tamsulosin (FLOMAX) 0.4 MG CAPS capsule   Other Relevant Orders   CBC with Differential/Platelet   Comp. Metabolic Panel (12)   PSA   Other Visit Diagnoses     BMI 22.0-22.9, adult    -  Primary   Hyperlipidemia, unspecified hyperlipidemia type       Relevant Medications   atorvastatin (LIPITOR) 20 MG tablet   Other Relevant Orders   Lipid panel       Meds ordered this encounter  Medications   atorvastatin (LIPITOR) 20 MG tablet    Sig: Take 1 tablet (20 mg total) by mouth daily.    Dispense:  30 tablet    Refill:  1    Pt due for annual exam 09-2018. Call for appt    Order Specific Question:   Supervising Provider    Answer:   Storm Frisk [[id number]]   tamsulosin (FLOMAX) 0.4 MG CAPS capsule    Sig: Take 1 capsule (0.4 mg total) by mouth daily.    Dispense:  30 capsule    Refill:  1    Order Specific Question:   Supervising Provider    Answer:   WRIGHT, PATRICK E [[id number]]  1. Hyperlipidemia, unspecified hyperlipidemia type Continue current regimen.  Patient education given on general health maintenance.  Fasting labs completed today.  Patient has upcoming appointment to establish care with Dr. Andrey Campanile at [facility] on October 02, 2021 - atorvastatin (LIPITOR) 20 MG tablet; Take 1 tablet (20 mg total) by mouth daily.  Dispense: 30 tablet; Refill: 1 - Lipid panel  2. Benign prostatic hyperplasia with urinary hesitancy Continue current regimen - tamsulosin (FLOMAX) 0.4 MG CAPS capsule; Take 1 capsule (0.4 mg total) by mouth daily.  Dispense: 30 capsule; Refill: 1 - CBC with Differential/Platelet - Comp. Metabolic Panel (12) - PSA  3. BMI 22.0-22.9, adult    I have reviewed the patient's medical history (PMH, PSH, Social History, Family History, Medications, and allergies) , and have been updated if relevant.  I spent 30 minutes reviewing chart and  face to face time with patient.     Follow-up: Return in about 23 days (around 10/02/2021) for with Dr. Andrey Campanile at [facility].    Kasandra Knudsen Mayers, PA-C

## 2021-09-09 NOTE — Progress Notes (Signed)
Patient has not eaten today and patient has not taken medication today. Patient denies pain at this time. Patient request refills on Flomax and atorvastatin. Patient takes aspirin every 3 days.

## 2021-09-10 ENCOUNTER — Telehealth: Payer: Self-pay | Admitting: *Deleted

## 2021-09-10 LAB — CBC WITH DIFFERENTIAL/PLATELET
Basophils Absolute: 0.1 10*3/uL (ref 0.0–0.2)
Basos: 1 %
EOS (ABSOLUTE): 0.3 10*3/uL (ref 0.0–0.4)
Eos: 4 %
Hematocrit: 46.6 % (ref 37.5–51.0)
Hemoglobin: 14.8 g/dL (ref 13.0–17.7)
Immature Grans (Abs): 0 10*3/uL (ref 0.0–0.1)
Immature Granulocytes: 0 %
Lymphocytes Absolute: 1.4 10*3/uL (ref 0.7–3.1)
Lymphs: 20 %
MCH: 26.6 pg (ref 26.6–33.0)
MCHC: 31.8 g/dL (ref 31.5–35.7)
MCV: 84 fL (ref 79–97)
Monocytes Absolute: 0.6 10*3/uL (ref 0.1–0.9)
Monocytes: 9 %
Neutrophils Absolute: 4.7 10*3/uL (ref 1.4–7.0)
Neutrophils: 66 %
Platelets: 230 10*3/uL (ref 150–450)
RBC: 5.57 x10E6/uL (ref 4.14–5.80)
RDW: 13.6 % (ref 11.6–15.4)
WBC: 7.1 10*3/uL (ref 3.4–10.8)

## 2021-09-10 LAB — COMP. METABOLIC PANEL (12)
AST: 20 IU/L (ref 0–40)
Albumin/Globulin Ratio: 2 (ref 1.2–2.2)
Albumin: 4.2 g/dL (ref 3.7–4.7)
Alkaline Phosphatase: 78 IU/L (ref 44–121)
BUN/Creatinine Ratio: 14 (ref 10–24)
BUN: 13 mg/dL (ref 8–27)
Bilirubin Total: 0.6 mg/dL (ref 0.0–1.2)
Calcium: 9.3 mg/dL (ref 8.6–10.2)
Chloride: 102 mmol/L (ref 96–106)
Creatinine, Ser: 0.9 mg/dL (ref 0.76–1.27)
Globulin, Total: 2.1 g/dL (ref 1.5–4.5)
Glucose: 104 mg/dL — ABNORMAL HIGH (ref 70–99)
Potassium: 4.4 mmol/L (ref 3.5–5.2)
Sodium: 142 mmol/L (ref 134–144)
Total Protein: 6.3 g/dL (ref 6.0–8.5)
eGFR: 91 mL/min/{1.73_m2} (ref >59)

## 2021-09-10 LAB — LIPID PANEL
Chol/HDL Ratio: 2.4 ratio (ref 0.0–5.0)
Cholesterol, Total: 152 mg/dL (ref 100–199)
HDL: 64 mg/dL (ref >39)
LDL Chol Calc (NIH): 77 mg/dL (ref 0–99)
Triglycerides: 51 mg/dL (ref 0–149)
VLDL Cholesterol Cal: 11 mg/dL (ref 5–40)

## 2021-09-10 LAB — PSA: Prostate Specific Ag, Serum: 2.7 ng/mL (ref 0.0–4.0)

## 2021-09-10 NOTE — Telephone Encounter (Signed)
-----   Message from Roney Jaffe, [place] sent at 09/10/2021  1:01 PM EDT ----- Please call patient and let him know that his kidney function and liver function are within normal limits.  He does not show signs of anemia.  His cholesterol is well controlled.  His screening for prostate cancer was negative.

## 2021-09-10 NOTE — Telephone Encounter (Signed)
MA UTR patient x2. Patient advised to return a phone call to [phone] for results.

## 2021-09-15 ENCOUNTER — Other Ambulatory Visit (HOSPITAL_BASED_OUTPATIENT_CLINIC_OR_DEPARTMENT_OTHER): Payer: Self-pay

## 2021-10-02 ENCOUNTER — Ambulatory Visit: Payer: Medicare HMO | Admitting: Family Medicine

## 2021-10-19 ENCOUNTER — Other Ambulatory Visit: Payer: Self-pay

## 2021-10-19 ENCOUNTER — Ambulatory Visit
Admission: RE | Admit: 2021-10-19 | Discharge: 2021-10-19 | Disposition: A | Discharge status: Home / Self Care | Payer: Medicare Other | Source: Ambulatory Visit | Attending: Internal Medicine | Admitting: Internal Medicine

## 2021-10-19 VITALS — BP 160/91 | HR 51 | Temp 97.5°F | Resp 18

## 2021-10-19 DIAGNOSIS — E785 Hyperlipidemia, unspecified: Secondary | ICD-10-CM | POA: Diagnosis not present

## 2021-10-19 DIAGNOSIS — N401 Enlarged prostate with lower urinary tract symptoms: Secondary | ICD-10-CM | POA: Diagnosis not present

## 2021-10-19 DIAGNOSIS — R3911 Hesitancy of micturition: Secondary | ICD-10-CM | POA: Diagnosis not present

## 2021-10-19 MED ORDER — ATORVASTATIN CALCIUM 20 MG PO TABS: 20.0000 mg | ORAL_TABLET | Freq: Every day | ORAL | 0 refills | Status: DC

## 2021-10-19 MED ORDER — TAMSULOSIN HCL 0.4 MG PO CAPS: 0.4000 mg | ORAL_CAPSULE | Freq: Every day | ORAL | 0 refills | Status: DC

## 2021-10-19 NOTE — ED Triage Notes (Signed)
Pt between establishing care at different facilities. Will run out of medications before next apt and asking for refills for:  Atorvastatin 20mg  qd  Tamsulosin .4mg  qd

## 2021-10-19 NOTE — Discharge Instructions (Signed)
Your meds been refilled.  Please follow-up with primary care as soon as possible.

## 2021-10-19 NOTE — ED Provider Notes (Signed)
[facility]    CSN: [id number] Arrival date & time: 10/19/21  1356      History   Chief Complaint Chief Complaint  Patient presents with   Medication Refill    HPI Thomas Mcbride is a 75 y.o. male.   Patient presents for medication refills for tamsulosin and atorvastatin.  Patient reports that he is currently in between establishing care between different PCPs and will run out of medications before his next appointment.  Next appointment with primary care at [facility] is on November 28.  Patient had appointment on October 20 but missed his appointment.  Patient was seen by [facility] on 09/09/2021 for medication refill.  Lab work was completed then that was all normal.  Patient reports that he has been taking these 2 medications for approximately 8-10 years and has been tolerating well.  Patient denies any chest pain, shortness of breath, and headache, blurred vision, nausea, vomiting, dizziness.   Medication Refill  Past Medical History:  Diagnosis Date   Allergy    Attention deficit hyperactivity disorder (ADHD)    BPH (benign prostatic hyperplasia)    Depression    Hyperlipidemia     Patient Active Problem List   Diagnosis Date Noted   Hyperlipidemia 09/09/2021   Bradycardia 04/11/2019   Chronic pain of right knee 11/18/2016   Elevated cholesterol 06/18/2016   BPH (benign prostatic hyperplasia) 12/05/2015   Nonspecific abnormal electrocardiogram (ECG) (EKG) 09/02/2014   Depression 06/22/2014   Mixed hyperlipidemia 05/02/2013    Past Surgical History:  Procedure Laterality Date   FRACTURE SURGERY  1972   leg   VASECTOMY         Home Medications    Prior to Admission medications   Medication Sig Start Date End Date Taking? Authorizing Provider  aspirin EC 81 MG tablet Take 81 mg by mouth daily. Every 3 days    [provider]  atorvastatin (LIPITOR) 20 MG tablet Take 1 tablet (20 mg total) by mouth daily. 10/19/21   Gustavus Bryant, FNP  Coenzyme Q10 (CO Q-10) 100 MG CAPS Take 1 tablet by mouth daily. 3 times a week    [provider]  GLUCOSAMINE-CHONDROITIN PO Take 1 tablet by mouth. occasionally when working out (4-6 monthly)    [provider]  Krill Oil 1000 MG CAPS Take by mouth.    [provider]  tamsulosin (FLOMAX) 0.4 MG CAPS capsule Take 1 capsule (0.4 mg total) by mouth daily. 10/19/21   Gustavus Bryant, FNP    Family History Family History  Problem Relation Age of Onset   Crohn's disease Mother    Cancer Mother        brain   Heart attack Father    Lymphoma Father    Cancer Father        marrow   Heart disease Father    Hyperlipidemia Father    Hypertension Father        ?   COPD Paternal Grandfather    Hypertension Sister     Social History Social History   Tobacco Use   Smoking status: Never   Smokeless tobacco: Never  Vaping Use   Vaping Use: Never used  Substance Use Topics   Alcohol use: No   Drug use: No     Allergies   Patient has no known allergies.   Review of Systems Review of Systems Per HPI  Physical Exam Triage Vital Signs ED Triage Vitals  Enc  Vitals Group     BP 10/19/21 1423 (!) 160/91     Pulse Rate 10/19/21 1423 (!) 51     Resp 10/19/21 1423 18     Temp 10/19/21 1423 (!) 97.5 F (36.4 C)     Temp Source 10/19/21 1423 Oral     SpO2 10/19/21 1423 98 %     Weight --      Height --      Head Circumference --      Peak Flow --      Pain Score 10/19/21 1424 0     Pain Loc --      Pain Edu? --      Excl. in [place]? --    No data found.  Updated Vital Signs BP (!) 160/91 (BP Location: Right Arm)   Pulse (!) 51   Temp (!) 97.5 F (36.4 C) (Oral)   Resp 18   SpO2 98%   Visual Acuity Right Eye Distance:   Left Eye Distance:   Bilateral Distance:    Right Eye Near:   Left Eye Near:    Bilateral Near:     Physical Exam Constitutional:      General: He is not in acute distress.    Appearance: Normal  appearance. He is not toxic-appearing or diaphoretic.  HENT:     Head: Normocephalic and atraumatic.  Eyes:     Extraocular Movements: Extraocular movements intact.     Conjunctiva/sclera: Conjunctivae normal.  Cardiovascular:     Rate and Rhythm: Normal rate and regular rhythm.     Pulses: Normal pulses.     Heart sounds: Normal heart sounds.  Pulmonary:     Effort: Pulmonary effort is normal. No respiratory distress.     Breath sounds: Normal breath sounds.  Neurological:     General: No focal deficit present.     Mental Status: He is alert and oriented to person, place, and time. Mental status is at baseline.  Psychiatric:        Mood and Affect: Mood normal.        Behavior: Behavior normal.        Thought Content: Thought content normal.        Judgment: Judgment normal.     UC Treatments / Results  Labs (all labs ordered are listed, but only abnormal results are displayed) Labs Reviewed - No data to display  EKG   Radiology No results found.  Procedures Procedures (including critical care time)  Medications Ordered in UC Medications - No data to display  Initial Impression / Assessment and Plan / UC Course  I have reviewed the triage vital signs and the nursing notes.  Pertinent labs & imaging results that were available during my care of the patient were reviewed by me and considered in my medical decision making (see chart for details).     Will refill medications as patient has tolerated these medications well and has been taking for multiple years.  Patient advised of the importance of following up with primary care at next appointment.  No red flags seen on exam.  No need for lab work at this time as patient recently had lab work completed.  Discussed return precautions.  Patient verbalized understanding and was agreeable with plan. Final Clinical Impressions(s) / UC Diagnoses   Final diagnoses:  Hyperlipidemia, unspecified hyperlipidemia type  Benign  prostatic hyperplasia with urinary hesitancy     Discharge Instructions      Your meds been refilled.  Please follow-up with primary care as soon as possible.     ED Prescriptions     Medication Sig Dispense Auth. Provider   atorvastatin (LIPITOR) 20 MG tablet Take 1 tablet (20 mg total) by mouth daily. 30 tablet [place], [place] E, [place]   tamsulosin (FLOMAX) 0.4 MG CAPS capsule Take 1 capsule (0.4 mg total) by mouth daily. 30 capsule [place], Michele Rockers, [place]      PDMP not reviewed this encounter.   Teodora Medici, [place] 10/19/21 [phone]

## 2021-11-10 ENCOUNTER — Other Ambulatory Visit: Payer: Self-pay

## 2021-11-10 ENCOUNTER — Ambulatory Visit (INDEPENDENT_AMBULATORY_CARE_PROVIDER_SITE_OTHER): Payer: Medicare Other | Admitting: Family Medicine

## 2021-11-10 VITALS — BP 149/91 | HR 63 | Temp 98.7°F | Resp 16 | Ht 67.0 in | Wt 143.0 lb

## 2021-11-10 DIAGNOSIS — N400 Enlarged prostate without lower urinary tract symptoms: Secondary | ICD-10-CM | POA: Diagnosis not present

## 2021-11-10 DIAGNOSIS — E785 Hyperlipidemia, unspecified: Secondary | ICD-10-CM | POA: Diagnosis not present

## 2021-11-10 DIAGNOSIS — Z7689 Persons encountering health services in other specified circumstances: Secondary | ICD-10-CM | POA: Diagnosis not present

## 2021-11-10 NOTE — Progress Notes (Signed)
Patient is here to est-care with new provider. Patient has no new concerns

## 2021-11-10 NOTE — Progress Notes (Signed)
New Patient Office Visit  Subjective:  Patient ID: Thomas Mcbride, male    DOB: 11-19-1946  Age: 75 y.o. MRN: [id number]  CC:  Chief Complaint  Patient presents with   Establish Care    HPI Thomas Mcbride presents for to establish care and for to review chronic med issues with med refills. Patient denies acute complaints or concerns.   Past Medical History:  Diagnosis Date   Allergy    Attention deficit hyperactivity disorder (ADHD)    BPH (benign prostatic hyperplasia)    Depression    Hyperlipidemia     Past Surgical History:  Procedure Laterality Date   FRACTURE SURGERY  1972   leg   VASECTOMY      Family History  Problem Relation Age of Onset   Crohn's disease Mother    Cancer Mother        brain   Heart attack Father    Lymphoma Father    Cancer Father        marrow   Heart disease Father    Hyperlipidemia Father    Hypertension Father        ?   COPD Paternal Grandfather    Hypertension Sister     Social History   Socioeconomic History   Marital status: Divorced    Spouse name: Not on file   Number of children: 2   Years of education: Not on file   Highest education level: Not on file  Occupational History   Occupation: Quarry manager ASSOCIATE    Employer: [employer]  Tobacco Use   Smoking status: Never   Smokeless tobacco: Never  Vaping Use   Vaping Use: Never used  Substance and Sexual Activity   Alcohol use: No   Drug use: No   Sexual activity: Not Currently    Birth control/protection: None  Other Topics Concern   Not on file  Social History Narrative   Exercises 3 x's week on eliptical machine.         Social Determinants of Health   Financial Resource Strain: Not on file  Food Insecurity: Not on file  Transportation Needs: Not on file  Physical Activity: Not on file  Stress: Not on file  Social Connections: Not on file  Intimate Partner Violence: Not on file    ROS Review of Systems  All other systems reviewed and are  negative.  Objective:   Today's Vitals: BP (!) 149/91   Pulse 63   Temp 98.7 F (37.1 C) (Oral)   Resp 16   Ht 5\' 7"  (1.702 m)   Wt 143 lb (64.9 kg)   SpO2 94%   BMI 22.40 kg/m   Physical Exam Vitals and nursing note reviewed.  Constitutional:      General: He is not in acute distress. Cardiovascular:     Rate and Rhythm: Normal rate and regular rhythm.  Pulmonary:     Effort: Pulmonary effort is normal.     Breath sounds: Normal breath sounds.  Neurological:     General: No focal deficit present.     Mental Status: He is alert and oriented to person, place, and time.    Assessment & Plan:   1. Hyperlipidemia, unspecified hyperlipidemia type Continue present management. Meds refilled.   2. Benign prostatic hyperplasia without lower urinary tract symptoms Appears stable. Continue present management. Meds refilled  3. Encounter to establish care   Outpatient Encounter Medications as of 11/10/2021  Medication Sig   aspirin EC 81 MG  tablet Take 81 mg by mouth daily. Every 3 days   atorvastatin (LIPITOR) 20 MG tablet Take 1 tablet (20 mg total) by mouth daily.   Coenzyme Q10 (CO Q-10) 100 MG CAPS Take 1 tablet by mouth daily. 3 times a week   GLUCOSAMINE-CHONDROITIN PO Take 1 tablet by mouth. occasionally when working out (4-6 monthly)   Krill Oil 1000 MG CAPS Take by mouth.   tamsulosin (FLOMAX) 0.4 MG CAPS capsule Take 1 capsule (0.4 mg total) by mouth daily.   No facility-administered encounter medications on file as of 11/10/2021.    Follow-up: Return in about 6 months (around 05/10/2022) for follow up.   Becky Sax, MD

## 2021-11-11 ENCOUNTER — Encounter: Payer: Self-pay | Admitting: Family Medicine

## 2021-11-13 ENCOUNTER — Other Ambulatory Visit: Payer: Self-pay | Admitting: *Deleted

## 2021-11-13 ENCOUNTER — Telehealth: Payer: Self-pay | Admitting: Family Medicine

## 2021-11-13 DIAGNOSIS — N401 Enlarged prostate with lower urinary tract symptoms: Secondary | ICD-10-CM

## 2021-11-13 DIAGNOSIS — E785 Hyperlipidemia, unspecified: Secondary | ICD-10-CM

## 2021-11-13 MED ORDER — TAMSULOSIN HCL 0.4 MG PO CAPS: 0.4000 mg | ORAL_CAPSULE | Freq: Every day | ORAL | 0 refills | Status: DC

## 2021-11-13 MED ORDER — ATORVASTATIN CALCIUM 20 MG PO TABS: 20.0000 mg | ORAL_TABLET | Freq: Every day | ORAL | 0 refills | Status: DC

## 2021-11-13 NOTE — Telephone Encounter (Signed)
Pt walked in few days after his appt asking for medications he understood were going to be sent in by Dr. Andrey Campanile   tamsulosin [facility]) 0.4 MG CAPS capsule [[id number]]   atorvastatin (LIPITOR) 20 MG tablet [[id number]]   Pt states he mentioned specifically the need to get both filled at the same time for 90 days each to make it easier for him to pick up.   Pharmacy  [facility] [address] [place]), [place] - [address]  [address] [place]) [place] [address]  Phone:  [phone]  Fax:  [phone]     Pt states he has 5 more days. THANK YOU

## 2022-02-16 ENCOUNTER — Other Ambulatory Visit: Payer: Self-pay | Admitting: Family Medicine

## 2022-02-16 DIAGNOSIS — N401 Enlarged prostate with lower urinary tract symptoms: Secondary | ICD-10-CM

## 2022-02-16 DIAGNOSIS — E785 Hyperlipidemia, unspecified: Secondary | ICD-10-CM

## 2022-02-16 DIAGNOSIS — R3911 Hesitancy of micturition: Secondary | ICD-10-CM

## 2022-06-08 ENCOUNTER — Other Ambulatory Visit: Payer: Self-pay | Admitting: *Deleted

## 2022-06-08 DIAGNOSIS — E785 Hyperlipidemia, unspecified: Secondary | ICD-10-CM

## 2022-06-08 MED ORDER — ATORVASTATIN CALCIUM 20 MG PO TABS: 20.0000 mg | ORAL_TABLET | Freq: Every day | ORAL | 1 refills | Status: DC

## 2022-06-09 ENCOUNTER — Other Ambulatory Visit: Payer: Self-pay | Admitting: Family Medicine

## 2022-06-09 DIAGNOSIS — N401 Enlarged prostate with lower urinary tract symptoms: Secondary | ICD-10-CM

## 2022-06-10 ENCOUNTER — Ambulatory Visit (INDEPENDENT_AMBULATORY_CARE_PROVIDER_SITE_OTHER): Payer: Medicare Other | Admitting: Family Medicine

## 2022-06-10 ENCOUNTER — Encounter: Payer: Self-pay | Admitting: Family Medicine

## 2022-06-10 VITALS — BP 132/76 | HR 49 | Temp 98.1°F | Resp 16 | Wt 145.6 lb

## 2022-06-10 DIAGNOSIS — Z1211 Encounter for screening for malignant neoplasm of colon: Secondary | ICD-10-CM | POA: Insufficient documentation

## 2022-06-10 DIAGNOSIS — R1084 Generalized abdominal pain: Secondary | ICD-10-CM | POA: Insufficient documentation

## 2022-06-10 DIAGNOSIS — R3911 Hesitancy of micturition: Secondary | ICD-10-CM

## 2022-06-10 DIAGNOSIS — E785 Hyperlipidemia, unspecified: Secondary | ICD-10-CM

## 2022-06-10 DIAGNOSIS — N401 Enlarged prostate with lower urinary tract symptoms: Secondary | ICD-10-CM

## 2022-06-10 DIAGNOSIS — K219 Gastro-esophageal reflux disease without esophagitis: Secondary | ICD-10-CM | POA: Insufficient documentation

## 2022-06-10 MED ORDER — ATORVASTATIN CALCIUM 20 MG PO TABS: 20.0000 mg | ORAL_TABLET | Freq: Every day | ORAL | 3 refills | Status: DC

## 2022-06-10 MED ORDER — TAMSULOSIN HCL 0.4 MG PO CAPS: 0.4000 mg | ORAL_CAPSULE | Freq: Every day | ORAL | 3 refills | Status: DC

## 2022-06-10 MED ORDER — TAMSULOSIN HCL 0.4 MG PO CAPS: ORAL_CAPSULE | ORAL | 1 refills | Status: DC

## 2022-06-10 MED ORDER — ATORVASTATIN CALCIUM 20 MG PO TABS: 20.0000 mg | ORAL_TABLET | Freq: Every day | ORAL | 1 refills | Status: DC

## 2022-06-10 NOTE — Progress Notes (Signed)
Patient is her for medication refill/ follow-up Patient has no new concerns today

## 2022-06-12 ENCOUNTER — Encounter: Payer: Self-pay | Admitting: Family Medicine

## 2022-06-12 NOTE — Progress Notes (Signed)
Established Patient Office Visit  Subjective    Patient ID: Thomas Mcbride, male    DOB: 1946/05/11  Age: 76 y.o. MRN: [id number]  CC:  Chief Complaint  Patient presents with   Follow-up    HPI Thomas Mcbride presents for routine follow up of chronic med issues. Patient denies acute complaints or concerns.    Outpatient Encounter Medications as of 06/10/2022  Medication Sig   aspirin EC 81 MG tablet Take 81 mg by mouth daily. Every 3 days   atorvastatin (LIPITOR) 20 MG tablet Take 1 tablet (20 mg total) by mouth daily.   Coenzyme Q10 (CO Q-10) 100 MG CAPS Take 1 tablet by mouth daily. 3 times a week   GLUCOSAMINE-CHONDROITIN PO Take 1 tablet by mouth. occasionally when working out (4-6 monthly)   Krill Oil 1000 MG CAPS Take by mouth.   tamsulosin (FLOMAX) 0.4 MG CAPS capsule Take 1 capsule (0.4 mg total) by mouth daily.   [DISCONTINUED] atorvastatin (LIPITOR) 20 MG tablet Take 1 tablet (20 mg total) by mouth daily.   [DISCONTINUED] atorvastatin (LIPITOR) 20 MG tablet Take 1 tablet by mouth daily.   [DISCONTINUED] atorvastatin (LIPITOR) 20 MG tablet Take 1 tablet (20 mg total) by mouth daily.   [DISCONTINUED] tamsulosin (FLOMAX) 0.4 MG CAPS capsule Take 1 capsule by mouth once daily   [DISCONTINUED] tamsulosin (FLOMAX) 0.4 MG CAPS capsule Tamsulosin   [DISCONTINUED] tamsulosin (FLOMAX) 0.4 MG CAPS capsule Tamsulosin   No facility-administered encounter medications on file as of 06/10/2022.    Past Medical History:  Diagnosis Date   Allergy    Attention deficit hyperactivity disorder (ADHD)    BPH (benign prostatic hyperplasia)    Depression    Hyperlipidemia     Past Surgical History:  Procedure Laterality Date   FRACTURE SURGERY  1972   leg   VASECTOMY      Family History  Problem Relation Age of Onset   Crohn's disease Mother    Cancer Mother        brain   Heart attack Father    Lymphoma Father    Cancer Father        marrow   Heart disease Father     Hyperlipidemia Father    Hypertension Father        ?   COPD Paternal Grandfather    Hypertension Sister     Social History   Socioeconomic History   Marital status: Divorced    Spouse name: Not on file   Number of children: 2   Years of education: Not on file   Highest education level: Not on file  Occupational History   Occupation: Quarry manager ASSOCIATE    Employer: [employer]  Tobacco Use   Smoking status: Never   Smokeless tobacco: Never  Vaping Use   Vaping Use: Never used  Substance and Sexual Activity   Alcohol use: No   Drug use: No   Sexual activity: Not Currently    Birth control/protection: None  Other Topics Concern   Not on file  Social History Narrative   Exercises 3 x's week on eliptical machine.         Social Determinants of Health   Financial Resource Strain: Not on file  Food Insecurity: Not on file  Transportation Needs: Not on file  Physical Activity: Not on file  Stress: Not on file  Social Connections: Not on file  Intimate Partner Violence: Not on file    Review of Systems  All other  systems reviewed and are negative.       Objective    BP 132/76   Pulse (!) 49   Temp 98.1 F (36.7 C) (Oral)   Resp 16   Wt 145 lb 9.6 oz (66 kg)   SpO2 96%   BMI 22.80 kg/m   Physical Exam Vitals and nursing note reviewed.  Constitutional:      General: He is not in acute distress. Cardiovascular:     Rate and Rhythm: Normal rate and regular rhythm.  Pulmonary:     Effort: Pulmonary effort is normal.     Breath sounds: Normal breath sounds.  Neurological:     General: No focal deficit present.     Mental Status: He is alert and oriented to person, place, and time.         Assessment & Plan:   1. Benign prostatic hyperplasia with urinary hesitancy Doing well with present management. Continue. Meds refilled - tamsulosin (FLOMAX) 0.4 MG CAPS capsule; Take 1 capsule (0.4 mg total) by mouth daily.  Dispense: 90 capsule; Refill:  3  2. Hyperlipidemia, unspecified hyperlipidemia type Continue present management. Meds refilled.  - atorvastatin (LIPITOR) 20 MG tablet; Take 1 tablet (20 mg total) by mouth daily.  Dispense: 90 tablet; Refill: 3  Return in about 1 year (around 06/11/2023) for physical.   Tommie Raymond, MD

## 2022-09-01 ENCOUNTER — Encounter: Payer: Self-pay | Admitting: Family Medicine

## 2022-09-01 ENCOUNTER — Ambulatory Visit (INDEPENDENT_AMBULATORY_CARE_PROVIDER_SITE_OTHER): Payer: Medicare Other | Admitting: Family Medicine

## 2022-09-01 VITALS — BP 135/85 | HR 50 | Temp 98.1°F | Resp 16 | Wt 145.0 lb

## 2022-09-01 DIAGNOSIS — R3911 Hesitancy of micturition: Secondary | ICD-10-CM | POA: Diagnosis not present

## 2022-09-01 DIAGNOSIS — N401 Enlarged prostate with lower urinary tract symptoms: Secondary | ICD-10-CM | POA: Diagnosis not present

## 2022-09-01 NOTE — Progress Notes (Signed)
Established Patient Office Visit  Subjective    Patient ID: Thomas Mcbride, male    DOB: 1946/05/12  Age: 76 y.o. MRN: [id number]  CC: No chief complaint on file.   HPI Thomas Mcbride presents with increased difficulty in urination over the past several weeks.    Outpatient Encounter Medications as of 09/01/2022  Medication Sig   aspirin EC 81 MG tablet Take 81 mg by mouth daily. Every 3 days   atorvastatin (LIPITOR) 20 MG tablet Take 1 tablet (20 mg total) by mouth daily.   Coenzyme Q10 (CO Q-10) 100 MG CAPS Take 1 tablet by mouth daily. 3 times a week   GLUCOSAMINE-CHONDROITIN PO Take 1 tablet by mouth. occasionally when working out (4-6 monthly)   Krill Oil 1000 MG CAPS Take by mouth.   tamsulosin (FLOMAX) 0.4 MG CAPS capsule Take 1 capsule (0.4 mg total) by mouth daily.   No facility-administered encounter medications on file as of 09/01/2022.    Past Medical History:  Diagnosis Date   Allergy    Attention deficit hyperactivity disorder (ADHD)    BPH (benign prostatic hyperplasia)    Depression    Hyperlipidemia     Past Surgical History:  Procedure Laterality Date   FRACTURE SURGERY  1972   leg   VASECTOMY      Family History  Problem Relation Age of Onset   Crohn's disease Mother    Cancer Mother        brain   Heart attack Father    Lymphoma Father    Cancer Father        marrow   Heart disease Father    Hyperlipidemia Father    Hypertension Father        ?   COPD Paternal Grandfather    Hypertension Sister     Social History   Socioeconomic History   Marital status: Divorced    Spouse name: Not on file   Number of children: 2   Years of education: Not on file   Highest education level: Not on file  Occupational History   Occupation: Quarry manager ASSOCIATE    Employer: [employer]  Tobacco Use   Smoking status: Never   Smokeless tobacco: Never  Vaping Use   Vaping Use: Never used  Substance and Sexual Activity   Alcohol use: No   Drug use: No    Sexual activity: Not Currently    Birth control/protection: None  Other Topics Concern   Not on file  Social History Narrative   Exercises 3 x's week on eliptical machine.         Social Determinants of Health   Financial Resource Strain: Not on file  Food Insecurity: Not on file  Transportation Needs: Not on file  Physical Activity: Not on file  Stress: Not on file  Social Connections: Not on file  Intimate Partner Violence: Not on file    Review of Systems  Genitourinary:  Negative for dysuria.  All other systems reviewed and are negative.       Objective    BP 135/85   Pulse (!) 50   Temp 98.1 F (36.7 C) (Oral)   Resp 16   Wt 145 lb (65.8 kg)   SpO2 96%   BMI 22.71 kg/m   Physical Exam Vitals and nursing note reviewed.  Constitutional:      General: He is not in acute distress. Cardiovascular:     Rate and Rhythm: Normal rate and regular rhythm.  Pulmonary:  Effort: Pulmonary effort is normal.     Breath sounds: Normal breath sounds.  Abdominal:     Palpations: Abdomen is soft.     Tenderness: There is no abdominal tenderness.  Neurological:     General: No focal deficit present.     Mental Status: He is alert and oriented to person, place, and time.         Assessment & Plan:   1. Benign prostatic hyperplasia with urinary hesitancy Increasing sx. Referral to urology for further eval/mgt    No follow-ups on file.   Becky Sax, MD

## 2022-09-01 NOTE — Progress Notes (Signed)
Patient is her for a referral to urologist. Patient said that he don't believe that his medication is working and would like to see a specialist

## 2022-09-23 DIAGNOSIS — R350 Frequency of micturition: Secondary | ICD-10-CM | POA: Diagnosis not present

## 2022-09-23 DIAGNOSIS — R3911 Hesitancy of micturition: Secondary | ICD-10-CM | POA: Diagnosis not present

## 2022-09-23 DIAGNOSIS — R3912 Poor urinary stream: Secondary | ICD-10-CM | POA: Diagnosis not present

## 2022-10-23 ENCOUNTER — Telehealth: Payer: Self-pay

## 2022-10-23 ENCOUNTER — Ambulatory Visit: Payer: Medicare Other

## 2022-10-23 NOTE — Telephone Encounter (Signed)
Called patient to complete AWV. Patient reports he was not notified about apt being sch. When called, patient happened to be at work, unable to complete AWV. Appointment cancelled. He is willing to reschedule.

## 2022-10-23 NOTE — Patient Instructions (Signed)
Health Maintenance, Male Adopting a healthy lifestyle and getting preventive care are important in promoting health and wellness. Ask your health care provider about: The right schedule for you to have regular tests and exams. Things you can do on your own to prevent diseases and keep yourself healthy. What should I know about diet, weight, and exercise? Eat a healthy diet  Eat a diet that includes plenty of vegetables, fruits, low-fat dairy products, and lean protein. Do not eat a lot of foods that are high in solid fats, added sugars, or sodium. Maintain a healthy weight Body mass index (BMI) is a measurement that can be used to identify possible weight problems. It estimates body fat based on height and weight. Your health care provider can help determine your BMI and help you achieve or maintain a healthy weight. Get regular exercise Get regular exercise. This is one of the most important things you can do for your health. Most adults should: Exercise for at least 150 minutes each week. The exercise should increase your heart rate and make you sweat (moderate-intensity exercise). Do strengthening exercises at least twice a week. This is in addition to the moderate-intensity exercise. Spend less time sitting. Even light physical activity can be beneficial. Watch cholesterol and blood lipids Have your blood tested for lipids and cholesterol at 74 years of age, then have this test every 5 years. You may need to have your cholesterol levels checked more often if: Your lipid or cholesterol levels are high. You are older than 74 years of age. You are at high risk for heart disease. What should I know about cancer screening? Many types of cancers can be detected early and may often be prevented. Depending on your health history and family history, you may need to have cancer screening at various ages. This may include screening for: Colorectal cancer. Prostate cancer. Skin cancer. Lung  cancer. What should I know about heart disease, diabetes, and high blood pressure? Blood pressure and heart disease High blood pressure causes heart disease and increases the risk of stroke. This is more likely to develop in people who have high blood pressure readings or are overweight. Talk with your health care provider about your target blood pressure readings. Have your blood pressure checked: Every 3-5 years if you are 18-39 years of age. Every year if you are 40 years old or older. If you are between the ages of 65 and 75 and are a current or former smoker, ask your health care provider if you should have a one-time screening for abdominal aortic aneurysm (AAA). Diabetes Have regular diabetes screenings. This checks your fasting blood sugar level. Have the screening done: Once every three years after age 45 if you are at a normal weight and have a low risk for diabetes. More often and at a younger age if you are overweight or have a high risk for diabetes. What should I know about preventing infection? Hepatitis B If you have a higher risk for hepatitis B, you should be screened for this virus. Talk with your health care provider to find out if you are at risk for hepatitis B infection. Hepatitis C Blood testing is recommended for: Everyone born from 1945 through 1965. Anyone with known risk factors for hepatitis C. Sexually transmitted infections (STIs) You should be screened each year for STIs, including gonorrhea and chlamydia, if: You are sexually active and are younger than 74 years of age. You are older than 74 years of age and your   health care provider tells you that you are at risk for this type of infection. Your sexual activity has changed since you were last screened, and you are at increased risk for chlamydia or gonorrhea. Ask your health care provider if you are at risk. Ask your health care provider about whether you are at high risk for HIV. Your health care provider  may recommend a prescription medicine to help prevent HIV infection. If you choose to take medicine to prevent HIV, you should first get tested for HIV. You should then be tested every 3 months for as long as you are taking the medicine. Follow these instructions at home: Alcohol use Do not drink alcohol if your health care provider tells you not to drink. If you drink alcohol: Limit how much you have to 0-2 drinks a day. Know how much alcohol is in your drink. In the [place] one drink equals one 12 oz bottle of beer (355 mL), one 5 oz glass of wine (148 mL), or one 1 oz glass of hard liquor (44 mL). Lifestyle Do not use any products that contain nicotine or tobacco. These products include cigarettes, chewing tobacco, and vaping devices, such as e-cigarettes. If you need help quitting, ask your health care provider. Do not use street drugs. Do not share needles. Ask your health care provider for help if you need support or information about quitting drugs. General instructions Schedule regular health, dental, and eye exams. Stay current with your vaccines. Tell your health care provider if: You often feel depressed. You have ever been abused or do not feel safe at home. Summary Adopting a healthy lifestyle and getting preventive care are important in promoting health and wellness. Follow your health care provider's instructions about healthy diet, exercising, and getting tested or screened for diseases. Follow your health care provider's instructions on monitoring your cholesterol and blood pressure. This information is not intended to replace advice given to you by your health care provider. Make sure you discuss any questions you have with your health care provider. Document Revised: 04/21/2021 Document Reviewed: 04/21/2021 Elsevier Patient Education  2023 Elsevier Inc.  

## 2022-11-30 ENCOUNTER — Telehealth: Payer: Self-pay | Admitting: Family Medicine

## 2022-11-30 NOTE — Telephone Encounter (Signed)
Left message for patient to call back and schedule Medicare Annual Wellness Visit (AWV) either virtually or phone  Left  my Zachery Conch number [phone]   Last AWV 12/05/15 please schedule with Nurse Health Adviser   45 min for awv-i and in office appointments 30 min for awv-s  phone/virtual appointments

## 2022-12-10 ENCOUNTER — Other Ambulatory Visit: Payer: Self-pay

## 2022-12-10 ENCOUNTER — Emergency Department (HOSPITAL_COMMUNITY): Payer: Medicare Other

## 2022-12-10 ENCOUNTER — Emergency Department (HOSPITAL_COMMUNITY)
Admission: EM | Admit: 2022-12-10 | Discharge: 2022-12-11 | Disposition: A | Discharge status: Home / Self Care | Payer: Medicare Other | Attending: Emergency Medicine | Admitting: Emergency Medicine

## 2022-12-10 DIAGNOSIS — R45850 Homicidal ideations: Secondary | ICD-10-CM | POA: Diagnosis not present

## 2022-12-10 DIAGNOSIS — Z1152 Encounter for screening for COVID-19: Secondary | ICD-10-CM | POA: Diagnosis not present

## 2022-12-10 DIAGNOSIS — R509 Fever, unspecified: Secondary | ICD-10-CM | POA: Diagnosis present

## 2022-12-10 DIAGNOSIS — R4182 Altered mental status, unspecified: Secondary | ICD-10-CM | POA: Insufficient documentation

## 2022-12-10 DIAGNOSIS — J101 Influenza due to other identified influenza virus with other respiratory manifestations: Secondary | ICD-10-CM | POA: Diagnosis not present

## 2022-12-10 DIAGNOSIS — F23 Brief psychotic disorder: Secondary | ICD-10-CM | POA: Diagnosis present

## 2022-12-10 DIAGNOSIS — R9431 Abnormal electrocardiogram [ECG] [EKG]: Secondary | ICD-10-CM | POA: Diagnosis not present

## 2022-12-10 DIAGNOSIS — R39198 Other difficulties with micturition: Secondary | ICD-10-CM | POA: Diagnosis not present

## 2022-12-10 LAB — URINALYSIS, ROUTINE W REFLEX MICROSCOPIC
Bacteria, UA: NONE SEEN
Bilirubin Urine: NEGATIVE
Glucose, UA: NEGATIVE mg/dL
Hgb urine dipstick: NEGATIVE
Ketones, ur: 5 mg/dL — AB
Nitrite: NEGATIVE
Protein, ur: NEGATIVE mg/dL
Specific Gravity, Urine: 1.008 (ref 1.005–1.030)
pH: 6 (ref 5.0–8.0)

## 2022-12-10 LAB — BASIC METABOLIC PANEL
Anion gap: 8 (ref 5–15)
BUN: 15 mg/dL (ref 8–23)
CO2: 27 mmol/L (ref 22–32)
Calcium: 9.2 mg/dL (ref 8.9–10.3)
Chloride: 101 mmol/L (ref 98–111)
Creatinine, Ser: 1.06 mg/dL (ref 0.61–1.24)
GFR, Estimated: >60 mL/min (ref >60)
Glucose, Bld: 155 mg/dL — ABNORMAL HIGH (ref 70–99)
Potassium: 4.1 mmol/L (ref 3.5–5.1)
Sodium: 136 mmol/L (ref 135–145)

## 2022-12-10 LAB — ETHANOL: Alcohol, Ethyl (B): <10 mg/dL (ref <10)

## 2022-12-10 LAB — ACETAMINOPHEN LEVEL: Acetaminophen (Tylenol), Serum: <10 ug/mL — ABNORMAL LOW (ref 10–30)

## 2022-12-10 LAB — CBC
HCT: 45.7 % (ref 39.0–52.0)
Hemoglobin: 14.4 g/dL (ref 13.0–17.0)
MCH: 27.5 pg (ref 26.0–34.0)
MCHC: 31.5 g/dL (ref 30.0–36.0)
MCV: 87.2 fL (ref 80.0–100.0)
Platelets: 285 10*3/uL (ref 150–400)
RBC: 5.24 MIL/uL (ref 4.22–5.81)
RDW: 14.1 % (ref 11.5–15.5)
WBC: 7.1 10*3/uL (ref 4.0–10.5)
nRBC: 0 % (ref 0.0–0.2)

## 2022-12-10 LAB — SALICYLATE LEVEL: Salicylate Lvl: <7 mg/dL — ABNORMAL LOW (ref 7.0–30.0)

## 2022-12-10 MED ORDER — ACETAMINOPHEN 325 MG PO TABS
650.0000 mg | ORAL_TABLET | Freq: Four times a day (QID) | ORAL | Status: DC | PRN
  Filled 2022-12-10: qty 2

## 2022-12-10 MED ORDER — TRAZODONE HCL 50 MG PO TABS
50.0000 mg | ORAL_TABLET | Freq: Every evening | ORAL | Status: DC | PRN
  Administered 2022-12-10: 50 mg via ORAL
  Filled 2022-12-10: qty 1

## 2022-12-10 MED ORDER — RISPERIDONE 0.5 MG PO TBDP
0.5000 mg | ORAL_TABLET | ORAL | Status: AC
  Administered 2022-12-10: 0.5 mg via ORAL
  Filled 2022-12-10: qty 1

## 2022-12-10 MED ORDER — DIPHENHYDRAMINE HCL 50 MG/ML IJ SOLN
25.0000 mg | Freq: Once | INTRAMUSCULAR | Status: AC
  Administered 2022-12-10: 25 mg via INTRAMUSCULAR
  Filled 2022-12-10: qty 1

## 2022-12-10 MED ORDER — LORAZEPAM 1 MG PO TABS: 1.0000 mg | ORAL_TABLET | Freq: Four times a day (QID) | ORAL | Status: DC | PRN

## 2022-12-10 MED ORDER — DIVALPROEX SODIUM 250 MG PO DR TAB
250.0000 mg | DELAYED_RELEASE_TABLET | Freq: Three times a day (TID) | ORAL | Status: DC
  Administered 2022-12-10 – 2022-12-11 (×3): 250 mg via ORAL
  Filled 2022-12-10 (×3): qty 1

## 2022-12-10 MED ORDER — LORAZEPAM 2 MG/ML IJ SOLN
2.0000 mg | Freq: Once | INTRAMUSCULAR | Status: AC
  Administered 2022-12-10: 2 mg via INTRAMUSCULAR
  Filled 2022-12-10: qty 1

## 2022-12-10 MED ORDER — DIPHENHYDRAMINE HCL 25 MG PO CAPS: 25.0000 mg | ORAL_CAPSULE | Freq: Four times a day (QID) | ORAL | Status: DC | PRN

## 2022-12-10 MED ORDER — HALOPERIDOL 5 MG PO TABS: 10.0000 mg | ORAL_TABLET | Freq: Once | ORAL | Status: DC

## 2022-12-10 MED ORDER — LORAZEPAM 0.5 MG PO TABS: 0.5000 mg | ORAL_TABLET | Freq: Four times a day (QID) | ORAL | Status: DC | PRN

## 2022-12-10 MED ORDER — MIRTAZAPINE 15 MG PO TBDP
15.0000 mg | ORAL_TABLET | Freq: Every day | ORAL | Status: DC
  Administered 2022-12-10: 15 mg via ORAL
  Filled 2022-12-10 (×2): qty 1

## 2022-12-10 MED ORDER — TAMSULOSIN HCL 0.4 MG PO CAPS
0.4000 mg | ORAL_CAPSULE | Freq: Every day | ORAL | Status: DC
  Administered 2022-12-10 – 2022-12-11 (×2): 0.4 mg via ORAL
  Filled 2022-12-10 (×2): qty 1

## 2022-12-10 NOTE — ED Provider Notes (Signed)
[name] EMERGENCY DEPARTMENT Provider Note   CSN: [id number] Arrival date & time: 12/10/22  0848     History  Chief Complaint  Patient presents with   Homicidal    Thomas Mcbride is a 76 y.o. male with past medical history significant for "psychotic break" with unknown psychiatric diagnosis who presents with concern for homicidal behavior with police.  He is having delusions of persecution, reports that he was being followed, was driving around parking lot threatening to kill everyone in the parking lot with his car, as well as had a loaded gun on the dashboard.  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  Patient denies SI, HI, AVH, behavior suggests homicidal tendencies.  No previous medication usage noted.  HPI     Home Medications Prior to Admission medications   Medication Sig Start Date End Date Taking? Authorizing Provider  aspirin EC 81 MG tablet Take 81 mg by mouth daily. Every 3 days    [provider]  atorvastatin (LIPITOR) 20 MG tablet Take 1 tablet (20 mg total) by mouth daily. 06/10/22   Georganna Skeans, MD  Coenzyme Q10 (CO Q-10) 100 MG CAPS Take 1 tablet by mouth daily. 3 times a week    [provider]  GLUCOSAMINE-CHONDROITIN PO Take 1 tablet by mouth. occasionally when working out (4-6 monthly)    [provider]  Krill Oil 1000 MG CAPS Take by mouth.    [provider]  tamsulosin (FLOMAX) 0.4 MG CAPS capsule Take 1 capsule (0.4 mg total) by mouth daily. 06/10/22   Georganna Skeans, MD      Allergies    Patient has no known allergies.    Review of Systems   Review of Systems  All other systems reviewed and are negative.   Physical Exam Updated Vital Signs BP (!) 169/95 (BP Location: Right Arm)   Pulse 92   Temp 97.9 F (36.6 C)   Resp 17   SpO2 97%  Physical Exam Vitals and nursing  note reviewed.  Constitutional:      General: He is not in acute distress.    Appearance: Normal appearance.  HENT:     Head: Normocephalic and atraumatic.  Eyes:     General:        Right eye: No discharge.        Left eye: No discharge.  Cardiovascular:     Rate and Rhythm: Normal rate and regular rhythm.     Heart sounds: No murmur heard.    No friction rub. No gallop.  Pulmonary:     Effort: Pulmonary effort is normal.     Breath sounds: Normal breath sounds.  Abdominal:     General: Bowel sounds are normal.     Palpations: Abdomen is soft.  Skin:    General: Skin is warm and dry.     Capillary Refill: Capillary refill takes less than 2 seconds.  Neurological:     Mental Status: He is alert and oriented to person, place, and time.  Psychiatric:     Comments: Patient with some rapid, tangential speech, delusions of persecution.  He is not nursing any active SI, HI, AVH at this time, he does seem to be responding to some internal stimuli.     ED Results / Procedures / Treatments   Labs (all labs ordered are listed, but only abnormal results  are displayed) Labs Reviewed  ACETAMINOPHEN LEVEL - Abnormal; Notable for the following components:      Result Value   Acetaminophen (Tylenol), Serum <10 (*)    All other components within normal limits  BASIC METABOLIC PANEL - Abnormal; Notable for the following components:   Glucose, Bld 155 (*)    All other components within normal limits  SALICYLATE LEVEL - Abnormal; Notable for the following components:   Salicylate Lvl <7.0 (*)    All other components within normal limits  ETHANOL  CBC    EKG None  Radiology No results found.  Procedures Procedures    Medications Ordered in ED Medications  acetaminophen (TYLENOL) tablet 650 mg (has no administration in time range)  LORazepam (ATIVAN) tablet 1 mg (has no administration in time range)  diphenhydrAMINE (BENADRYL) capsule 25 mg (has no administration in time  range)    ED Course/ Medical Decision Making/ A&P Clinical Course as of 12/10/22 1538  Thu Dec 10, 2022  1454 Psych clear for TTS eval [CP]    Clinical Course User Index [CP] Olene Floss, PA-C                           Medical Decision Making Amount and/or Complexity of Data Reviewed Labs: ordered.   Patient is a 76 y.o. male  who presents to the emergency department for psychiatric complaint.  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  Patient with very mild hyperglycemia, glucose 155 on nonfasting lab value, otherwise unremarkable, no evidence of ethanol, salicylate, acetaminophen intoxication.  Medications: Patient currently cooperative, he has Tylenol as needed for pain, do not think he requires for sedation but will order medication for possible sleep, agitation at this time  Disposition: Patient is otherwise medically cleared at this time pending medical clearance laboratory evaluation. Will consult TTS and appreciate their recommendations.  I discussed this case with my attending physician Dr. Lockie Mola who cosigned this note including patient's presenting symptoms, physical exam, and planned diagnostics and interventions. Attending physician stated agreement with plan or made changes to plan which were implemented.   Final Clinical Impression(s) / ED Diagnoses Final diagnoses:  Homicidal ideation    Rx / DC Orders ED Discharge Orders     None         Olene Floss, PA-C 12/10/22 1538    Virgina Norfolk, DO 12/10/22 1543

## 2022-12-10 NOTE — ED Notes (Signed)
[facility] PAPERWORK IS IN PROCESS
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
## 2022-12-10 NOTE — ED Notes (Signed)
Son updated with permission of patient

## 2022-12-10 NOTE — ED Provider Notes (Addendum)
Patient reporting difficulty urinating. He normally takes flomax daily and he hasn't had it "in awhile." Feeling fullness in his suprapubic region but has not had any abdominal pain, dysuria, hematuria, fever/chills.  No flank pain.  He states he does have this problem when he does not take the Flomax.  Does not remember the last time he urinated.  Physical exam demonstrates fullness in the abdomen diffusely without any particular areas of point tenderness, no CVA tenderness palpation.  Patient had a UA obtained today did not demonstrate any UTI or hematuria.  Will straight cath patient to relieve symptoms and give Flomax.  Will continue to monitor.     Loetta Rough, MD 12/10/22 2130

## 2022-12-10 NOTE — ED Notes (Signed)
Patient out in the hallway yelling and screaming stating "Jesus is our savior I know all of you hear right now the end times are near and jesus is coming!" Patient is unable to be verbally redirected and continues to scream and pace around hallways. Security present at this time. Provider aware of same, will administer ordered medications.

## 2022-12-10 NOTE — ED Notes (Signed)
Patient stated to this RN that he is willing go to go CT scan at this time. This RN called CT to let them know patient was ready.

## 2022-12-10 NOTE — Progress Notes (Addendum)
Inpatient Behavioral Health Placement  Pt meets inpatient criteria per [facility].  There are no available beds at [facility] per Dr. Caleb Popp no available beds at [facility]. Referral was sent to the following facilities;   Destination  Service Provider Address Phone Fax  [facility]  [address], [place] [address] [phone]  [facility]  [address] [place] [address] [phone]  [facility]  [address] [place] [address] [phone]  [facility]  [address], [place] [address] [phone]  [facility]  [address], [place] [address] [phone]  [facility]  [address], [place] [address] [phone]  [facility]  [address] [place] [address] [phone]  [facility]  [address] [place] [address] [phone]  [facility]  [address] [place] [address] [phone]  [facility]  [address] [place] [address] [phone]  [facility]  [address], [place] [address] [phone]  [facility]  [address] [place] [address] [phone]  [facility] [address]  [address] [place] [address] [phone]  [facility]  [address] [place] [address] [phone]  [facility]  [address] [place] [address] [phone]  [facility]  [address] [place] [address] [phone]  [facility]  [address],  [place] [address] [phone]  [facility]  [address] [place] [address] [phone]  [facility]  [address], [place] [address] [phone]  [facility]  [address] [place], [place] [address] [phone]    Situation ongoing,  CSW will follow up.   Maryjean Ka, MSW, [facility] 12/10/2022  @ 8:47 PM

## 2022-12-10 NOTE — ED Notes (Addendum)
Pt urinated on self at this time. Pt placed in a brief and clean set of scrubs.

## 2022-12-10 NOTE — ED Notes (Signed)
Pt straight cathed at this time with Deritra CMA as witness. 16 fr coude catheter used. of urine returned. Pt tolerated well.
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
## 2022-12-10 NOTE — Progress Notes (Addendum)
Pt was accepted to [facility] 12/11/22; Main campus   Pt meets inpatient criteria per Joaquin Courts, FNP  Attending Physician will be Dr. Estill Cotta  Report can be called to: [phone] (Pager number, please leave a return phone number to receive a phone call back)  Pt can arrive after 8:00am   Care Team notified: Carley Hammed, RN, Dr. Toni Amend,  Sharolyn Douglas, RN, [address], LCAS  [place], LCSWA 12/10/2022 @ 8:53 PM

## 2022-12-10 NOTE — ED Notes (Signed)
Ivc paperwork done copies made  and attached to the clipboard  in orange zone the original paperwork is in the red folder
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
## 2022-12-10 NOTE — ED Notes (Signed)
Pt states having difficulty urinating at this time and that he normally gets Flomax at night. MD informed of situation. Awaiting medication orders.

## 2022-12-10 NOTE — ED Notes (Signed)
Pt continually pacing around at this time and states he is still having difficulty urinating. MD aware of situation. Awaiting further orders.

## 2022-12-10 NOTE — ED Notes (Signed)
Pt belongings placed in locker number 3 
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
## 2022-12-11 DIAGNOSIS — R39198 Other difficulties with micturition: Secondary | ICD-10-CM | POA: Diagnosis not present

## 2022-12-11 LAB — CBG MONITORING, ED: Glucose-Capillary: 98 mg/dL (ref 70–99)

## 2022-12-11 LAB — RESP PANEL BY RT-PCR (RSV, FLU A&B, COVID)  RVPGX2
Influenza A by PCR: NEGATIVE
Influenza B by PCR: NEGATIVE
Resp Syncytial Virus by PCR: NEGATIVE
SARS Coronavirus 2 by RT PCR: NEGATIVE

## 2022-12-11 NOTE — ED Provider Notes (Addendum)
BH team indicates pt accepted to [facility], Dr Loyola Mast:     Pt alert, content, no distress. Pt appears stable for transfer/transport.       Thomas Laine, MD 12/11/22 1321

## 2022-12-11 NOTE — ED Provider Notes (Signed)
Emergency Medicine Observation Re-evaluation Note  Thomas Mcbride is a 76 y.o. male, seen on rounds today.  Pt initially presented to the ED for complaints of Homicidal Currently, the patient is resting.  Physical Exam  BP 124/61 (BP Location: Left Arm)   Pulse 67   Temp 97.6 F (36.4 C) (Oral)   Resp 18   SpO2 100%  Physical Exam General: no acute distress Abd: no tenderness or fullness Lungs: normal effort Psych: no agitation  ED Course / MDM  EKG:   I have reviewed the labs performed to date as well as medications administered while in observation.  Recent changes in the last 24 hours include flomax, home meds.  Plan  Current plan is for Psychiatric admission. Seems to be no longer having issues with urine output. He also tells me he's on finasteride. Will have pharmacy verify his meds then order home meds.    Pricilla Loveless, MD 12/11/22 [phone]

## 2022-12-11 NOTE — ED Notes (Signed)
Pt has signed Consent to transfer copy placed in folder and medical records drawer

## 2022-12-11 NOTE — ED Notes (Signed)
Pt gives permission to speak with son about care

## 2022-12-11 NOTE — ED Notes (Signed)
WAITING FOR LABS TO CALL FOR TX TO RECEIVING FACILITY

## 2022-12-11 NOTE — ED Notes (Signed)
Pt states he was fearful yesterday d/t nobody explaining the reasons why he was being kept or what was going on. Pt states he is in a better mood now since he is aware of how the flow of things is going.

## 2022-12-11 NOTE — ED Notes (Signed)
Pt used restroom without incident

## 2022-12-19 DIAGNOSIS — M25562 Pain in left knee: Secondary | ICD-10-CM | POA: Diagnosis not present

## 2022-12-19 DIAGNOSIS — F23 Brief psychotic disorder: Secondary | ICD-10-CM | POA: Diagnosis not present

## 2022-12-19 DIAGNOSIS — M25561 Pain in right knee: Secondary | ICD-10-CM | POA: Diagnosis not present

## 2022-12-19 DIAGNOSIS — I10 Essential (primary) hypertension: Secondary | ICD-10-CM | POA: Diagnosis not present

## 2022-12-19 DIAGNOSIS — F259 Schizoaffective disorder, unspecified: Secondary | ICD-10-CM | POA: Diagnosis not present

## 2022-12-19 DIAGNOSIS — E782 Mixed hyperlipidemia: Secondary | ICD-10-CM | POA: Diagnosis not present

## 2022-12-19 DIAGNOSIS — N400 Enlarged prostate without lower urinary tract symptoms: Secondary | ICD-10-CM | POA: Diagnosis not present

## 2022-12-19 DIAGNOSIS — K219 Gastro-esophageal reflux disease without esophagitis: Secondary | ICD-10-CM | POA: Diagnosis not present

## 2022-12-19 DIAGNOSIS — Z781 Physical restraint status: Secondary | ICD-10-CM | POA: Diagnosis not present

## 2022-12-19 DIAGNOSIS — R69 Illness, unspecified: Secondary | ICD-10-CM | POA: Diagnosis not present

## 2022-12-19 DIAGNOSIS — H6122 Impacted cerumen, left ear: Secondary | ICD-10-CM | POA: Diagnosis not present

## 2022-12-25 ENCOUNTER — Inpatient Hospital Stay: Payer: Medicare Other | Admitting: Family Medicine

## 2023-01-13 DIAGNOSIS — R69 Illness, unspecified: Secondary | ICD-10-CM | POA: Diagnosis not present

## 2023-01-13 DIAGNOSIS — F330 Major depressive disorder, recurrent, mild: Secondary | ICD-10-CM | POA: Diagnosis not present

## 2023-01-14 DIAGNOSIS — R69 Illness, unspecified: Secondary | ICD-10-CM | POA: Diagnosis not present

## 2023-01-14 DIAGNOSIS — F330 Major depressive disorder, recurrent, mild: Secondary | ICD-10-CM | POA: Diagnosis not present

## 2023-01-20 DIAGNOSIS — R69 Illness, unspecified: Secondary | ICD-10-CM | POA: Diagnosis not present

## 2023-01-20 DIAGNOSIS — F330 Major depressive disorder, recurrent, mild: Secondary | ICD-10-CM | POA: Diagnosis not present

## 2023-01-26 ENCOUNTER — Encounter: Payer: Self-pay | Admitting: Family Medicine

## 2023-01-26 ENCOUNTER — Ambulatory Visit (INDEPENDENT_AMBULATORY_CARE_PROVIDER_SITE_OTHER): Payer: Medicare HMO | Admitting: Family Medicine

## 2023-01-26 VITALS — BP 128/70 | HR 67 | Temp 98.1°F | Resp 18 | Ht 67.0 in | Wt 138.6 lb

## 2023-01-26 DIAGNOSIS — I10 Essential (primary) hypertension: Secondary | ICD-10-CM | POA: Diagnosis not present

## 2023-01-26 DIAGNOSIS — Z0001 Encounter for general adult medical examination with abnormal findings: Secondary | ICD-10-CM | POA: Diagnosis not present

## 2023-01-26 DIAGNOSIS — R69 Illness, unspecified: Secondary | ICD-10-CM | POA: Diagnosis not present

## 2023-01-26 DIAGNOSIS — N400 Enlarged prostate without lower urinary tract symptoms: Secondary | ICD-10-CM

## 2023-01-26 DIAGNOSIS — E785 Hyperlipidemia, unspecified: Secondary | ICD-10-CM | POA: Diagnosis not present

## 2023-01-26 DIAGNOSIS — Z0000 Encounter for general adult medical examination without abnormal findings: Secondary | ICD-10-CM

## 2023-01-26 DIAGNOSIS — F330 Major depressive disorder, recurrent, mild: Secondary | ICD-10-CM | POA: Diagnosis not present

## 2023-01-26 NOTE — Progress Notes (Unsigned)
Subjective:   Thomas Mcbride is a 77 y.o. male who presents for Medicare Annual/Subsequent preventive examination.  Review of Systems    Refer to Pcp       Objective:    Today's Vitals   01/26/23 1453  BP: 128/70  Pulse: 67  Resp: 18  Temp: 98.1 F (36.7 C)  TempSrc: Oral  SpO2: 95%  Weight: 138 lb 9.6 oz (62.9 kg)  Height: 5' 7"$  (1.702 m)   Body mass index is 21.71 kg/m.     12/10/2022    9:25 AM 08/20/2019    9:41 PM 08/26/2014   11:27 AM  Advanced Directives  Does Patient Have a Medical Advance Directive? No No No  Would patient like information on creating a medical advance directive?  No - Patient declined     Current Medications (verified) Outpatient Encounter Medications as of 01/26/2023  Medication Sig   aspirin EC 81 MG tablet Take 81 mg by mouth daily. Every 3 days   atorvastatin (LIPITOR) 20 MG tablet Take 1 tablet (20 mg total) by mouth daily.   chlorproMAZINE (THORAZINE) 50 MG tablet Take 50 mg by mouth 5 (five) times daily.   Coenzyme Q10 (CO Q-10) 100 MG CAPS Take 1 tablet by mouth daily. 3 times a week   finasteride (PROSCAR) 5 MG tablet Take 5 mg by mouth daily.   GLUCOSAMINE-CHONDROITIN PO Take 1 tablet by mouth. occasionally when working out (4-6 monthly)   Krill Oil 1000 MG CAPS Take by mouth.   tamsulosin (FLOMAX) 0.4 MG CAPS capsule Take 1 capsule (0.4 mg total) by mouth daily.   No facility-administered encounter medications on file as of 01/26/2023.    Allergies (verified) Patient has no known allergies.   History: Past Medical History:  Diagnosis Date   Allergy    Attention deficit hyperactivity disorder (ADHD)    BPH (benign prostatic hyperplasia)    Depression    Hyperlipidemia    Past Surgical History:  Procedure Laterality Date   FRACTURE SURGERY  1972   leg   VASECTOMY     Family History  Problem Relation Age of Onset   Crohn's disease Mother    Cancer Mother        brain   Heart attack Father    Lymphoma Father     Cancer Father        marrow   Heart disease Father    Hyperlipidemia Father    Hypertension Father        ?   COPD Paternal Grandfather    Hypertension Sister    Social History   Socioeconomic History   Marital status: Divorced    Spouse name: Not on file   Number of children: 2   Years of education: Not on file   Highest education level: Not on file  Occupational History   Occupation: Development worker, community ASSOCIATE    Employer: [employer]  Tobacco Use   Smoking status: Never   Smokeless tobacco: Never  Vaping Use   Vaping Use: Never used  Substance and Sexual Activity   Alcohol use: No   Drug use: No   Sexual activity: Not Currently    Birth control/protection: None  Other Topics Concern   Not on file  Social History Narrative   Exercises 3 x's week on eliptical machine.         Social Determinants of Health   Financial Resource Strain: Not on file  Food Insecurity: Not on file  Transportation Needs: Not  on file  Physical Activity: Not on file  Stress: Not on file  Social Connections: Not on file    Tobacco Counseling Counseling given: Not Answered   Clinical Intake:  Pre-visit preparation completed: No  Pain : No/denies pain     Diabetes: No     Diabetic?n/a  Interpreter Needed?: No      Activities of Daily Living     No data to display          Patient Care Team: Dorna Mai, MD as PCP - General (Family Medicine)  Indicate any recent Medical Services you may have received from other than [facility] providers in the past year (date may be approximate).     Assessment:   This is a routine wellness examination for Thomas Mcbride.  Hearing/Vision screen No results found.  Dietary issues and exercise activities discussed:     Goals Addressed   None   Depression Screen    01/26/2023    2:55 PM 06/10/2022    9:23 AM 09/09/2021   10:31 AM 05/10/2018    3:43 PM 10/04/2017    4:15 PM 01/02/2017    1:32 PM 10/21/2016    9:07 AM  PHQ 2/9 Scores   PHQ - 2 Score 0 0 0 0 0 0 0  PHQ- 9 Score  0 3        Fall Risk    06/10/2022    9:23 AM 05/10/2018    3:43 PM 10/04/2017    4:15 PM 01/02/2017    1:32 PM 10/21/2016    9:07 AM  Fall Risk   Falls in the past year? 0 No No No No  Number falls in past yr: 0      Injury with Fall? 0        FALL RISK PREVENTION PERTAINING TO THE HOME:  Any stairs in or around the home? No  If so, are there any without handrails? No  Home free of loose throw rugs in walkways, pet beds, electrical cords, etc? Yes  Adequate lighting in your home to reduce risk of falls? Yes   ASSISTIVE DEVICES UTILIZED TO PREVENT FALLS:  Life alert? No  Use of a cane, walker or w/c? No  Grab bars in the bathroom? No  Shower chair or bench in shower? No  Elevated toilet seat or a handicapped toilet? No   TIMED UP AND GO:  Was the test performed? No .  Length of time to ambulate 10 feet:  sec.   Gait steady and fast with assistive device  Cognitive Function:        Immunizations Immunization History  Administered Date(s) Administered   Influenza, High Dose Seasonal PF 10/04/2017, 10/09/2018   Influenza,inj,Quad PF,6+ Mos 12/05/2015, 10/24/2016   Influenza-Unspecified 11/12/2016   Pneumococcal Conjugate-13 10/02/2014   Tdap 05/02/2013    TDAP status: Up to date  Flu Vaccine status: Up to date  Pneumococcal vaccine status: Declined,  Education has been provided regarding the importance of this vaccine but patient still declined. Advised may receive this vaccine at local pharmacy or Health Dept. Aware to provide a copy of the vaccination record if obtained from local pharmacy or Health Dept. Verbalized acceptance and understanding.   Covid-19 vaccine status: Declined, Education has been provided regarding the importance of this vaccine but patient still declined. Advised may receive this vaccine at local pharmacy or Health Dept.or vaccine clinic. Aware to provide a copy of the vaccination record if  obtained from local pharmacy or Health Dept.  Verbalized acceptance and understanding.  Qualifies for Shingles Vaccine? Yes   Zostavax completed No   Shingrix Completed?: No.    Education has been provided regarding the importance of this vaccine. Patient has been advised to call insurance company to determine out of pocket expense if they have not yet received this vaccine. Advised may also receive vaccine at local pharmacy or Health Dept. Verbalized acceptance and understanding.  Screening Tests Health Maintenance  Topic Date Due   INFLUENZA VACCINE  03/14/2023 (Originally 07/14/2022)   Zoster Vaccines- Shingrix (1 of 2) 04/26/2023 (Originally 09/14/1998)   Pneumonia Vaccine 55+ Years old (2 of 2 - PPSV23 or PCV20) 09/02/2023 (Originally 10/03/2015)   COLONOSCOPY (Pts 45-63yr Insurance coverage will need to be confirmed)  03/31/2023   DTaP/Tdap/Td (2 - Td or Tdap) 05/03/2023   Medicare Annual Wellness (AWV)  01/27/2024   Hepatitis C Screening  Completed   HPV VACCINES  Aged Out   COVID-19 Vaccine  Discontinued    Health Maintenance  There are no preventive care reminders to display for this patient.  Colorectal cancer screening: Type of screening: Colonoscopy. Completed 2014. Repeat every 10 years  Lung Cancer Screening: (Low Dose CT Chest recommended if Age 75-80years, 30 pack-year currently smoking OR have quit w/in 15years.) does not qualify.   Lung Cancer Screening Referral: n/a  Additional Screening:  Hepatitis C Screening: does not qualify; Completed 2016  Vision Screening: Recommended annual ophthalmology exams for early detection of glaucoma and other disorders of the eye. Is the patient up to date with their annual eye exam?  No  Who is the provider or what is the name of the office in which the patient attends annual eye exams? N/a If pt is not established with a provider, would they like to be referred to a provider to establish care? No .   Dental Screening:  Recommended annual dental exams for proper oral hygiene  Community Resource Referral / Chronic Care Management: CRR required this visit?  No   CCM required this visit?  No      Plan:     I have personally reviewed and noted the following in the patient's chart:   Medical and social history Use of alcohol, tobacco or illicit drugs  Current medications and supplements including opioid prescriptions. Patient is not currently taking opioid prescriptions. Functional ability and status Nutritional status Physical activity Advanced directives List of other physicians Hospitalizations, surgeries, and ER visits in previous 12 months Vitals Screenings to include cognitive, depression, and falls Referrals and appointments  In addition, I have reviewed and discussed with patient certain preventive protocols, quality metrics, and best practice recommendations. A written personalized care plan for preventive services as well as general preventive health recommendations were provided to patient.     AMelene Plan RMA   01/26/2023   Nurse Notes:

## 2023-01-27 NOTE — Progress Notes (Unsigned)
New Patient Office Visit  Subjective    Patient ID: Thomas Mcbride, male    DOB: [birth year]  Age: 77 y.o. MRN: [id number]  CC: No chief complaint on file.   HPI Thomas Mcbride presents to establish care ***  Outpatient Encounter Medications as of 01/26/2023  Medication Sig   aspirin EC 81 MG tablet Take 81 mg by mouth daily. Every 3 days   atorvastatin (LIPITOR) 20 MG tablet Take 1 tablet (20 mg total) by mouth daily.   chlorproMAZINE (THORAZINE) 50 MG tablet Take 50 mg by mouth 5 (five) times daily.   Coenzyme Q10 (CO Q-10) 100 MG CAPS Take 1 tablet by mouth daily. 3 times a week   finasteride (PROSCAR) 5 MG tablet Take 5 mg by mouth daily.   GLUCOSAMINE-CHONDROITIN PO Take 1 tablet by mouth. occasionally when working out (4-6 monthly)   Krill Oil 1000 MG CAPS Take by mouth.   tamsulosin (FLOMAX) 0.4 MG CAPS capsule Take 1 capsule (0.4 mg total) by mouth daily.   No facility-administered encounter medications on file as of 01/26/2023.    Past Medical History:  Diagnosis Date   Allergy    Attention deficit hyperactivity disorder (ADHD)    BPH (benign prostatic hyperplasia)    Depression    Hyperlipidemia     Past Surgical History:  Procedure Laterality Date   FRACTURE SURGERY  1972   leg   VASECTOMY      Family History  Problem Relation Age of Onset   Crohn's disease Mother    Cancer Mother        brain   Heart attack Father    Lymphoma Father    Cancer Father        marrow   Heart disease Father    Hyperlipidemia Father    Hypertension Father        ?   COPD Paternal Grandfather    Hypertension Sister     Social History   Socioeconomic History   Marital status: Divorced    Spouse name: Not on file   Number of children: 2   Years of education: Not on file   Highest education level: Not on file  Occupational History   Occupation: Development worker, community ASSOCIATE    Employer: [employer]  Tobacco Use   Smoking status: Never   Smokeless tobacco: Never  Vaping Use    Vaping Use: Never used  Substance and Sexual Activity   Alcohol use: No   Drug use: No   Sexual activity: Not Currently    Birth control/protection: None  Other Topics Concern   Not on file  Social History Narrative   Exercises 3 x's week on eliptical machine.         Social Determinants of Health   Financial Resource Strain: Not on file  Food Insecurity: Not on file  Transportation Needs: Not on file  Physical Activity: Not on file  Stress: Not on file  Social Connections: Not on file  Intimate Partner Violence: Not on file    ROS      Objective    BP 128/70   Pulse 67   Temp 98.1 F (36.7 C) (Oral)   Resp 18   Ht 5' 7"$  (1.702 m)   Wt 138 lb 9.6 oz (62.9 kg)   SpO2 95%   BMI 21.71 kg/m   Physical Exam  {Labs (Optional):23779}    Assessment & Plan:   Problem List Items Addressed This Visit  Genitourinary   BPH (benign prostatic hyperplasia)   Relevant Medications   finasteride (PROSCAR) 5 MG tablet   Other Visit Diagnoses     Encounter for Medicare annual wellness exam    -  Primary       Return in 1 year (on 01/27/2024).   Becky Sax, MD

## 2023-01-28 ENCOUNTER — Encounter: Payer: Self-pay | Admitting: Family Medicine

## 2023-02-03 DIAGNOSIS — N401 Enlarged prostate with lower urinary tract symptoms: Secondary | ICD-10-CM | POA: Diagnosis not present

## 2023-02-03 DIAGNOSIS — R350 Frequency of micturition: Secondary | ICD-10-CM | POA: Diagnosis not present

## 2023-02-03 DIAGNOSIS — R351 Nocturia: Secondary | ICD-10-CM | POA: Diagnosis not present

## 2023-02-05 ENCOUNTER — Encounter (HOSPITAL_COMMUNITY): Payer: Self-pay | Admitting: Psychiatry

## 2023-02-05 ENCOUNTER — Ambulatory Visit (HOSPITAL_COMMUNITY)
Admission: EM | Admit: 2023-02-05 | Discharge: 2023-02-06 | Disposition: A | Discharge status: Home / Self Care | Payer: Medicare HMO | Attending: Family | Admitting: Family

## 2023-02-05 DIAGNOSIS — Z1152 Encounter for screening for COVID-19: Secondary | ICD-10-CM | POA: Insufficient documentation

## 2023-02-05 DIAGNOSIS — G259 Extrapyramidal and movement disorder, unspecified: Secondary | ICD-10-CM

## 2023-02-05 DIAGNOSIS — Z8659 Personal history of other mental and behavioral disorders: Secondary | ICD-10-CM

## 2023-02-05 DIAGNOSIS — F29 Unspecified psychosis not due to a substance or known physiological condition: Secondary | ICD-10-CM | POA: Insufficient documentation

## 2023-02-05 DIAGNOSIS — R5383 Other fatigue: Secondary | ICD-10-CM | POA: Insufficient documentation

## 2023-02-05 DIAGNOSIS — G2119 Other drug induced secondary parkinsonism: Secondary | ICD-10-CM | POA: Diagnosis not present

## 2023-02-05 DIAGNOSIS — R69 Illness, unspecified: Secondary | ICD-10-CM | POA: Diagnosis not present

## 2023-02-05 LAB — TSH: TSH: 4.388 u[IU]/mL (ref 0.350–4.500)

## 2023-02-05 LAB — LIPID PANEL
Cholesterol: 166 mg/dL (ref 0–200)
HDL: 87 mg/dL (ref >40)
LDL Cholesterol: 68 mg/dL (ref 0–99)
Total CHOL/HDL Ratio: 1.9 RATIO
Triglycerides: 55 mg/dL (ref <150)
VLDL: 11 mg/dL (ref 0–40)

## 2023-02-05 LAB — CBC WITH DIFFERENTIAL/PLATELET
Abs Immature Granulocytes: 0.02 10*3/uL (ref 0.00–0.07)
Basophils Absolute: 0.1 10*3/uL (ref 0.0–0.1)
Basophils Relative: 1 %
Eosinophils Absolute: 0.3 10*3/uL (ref 0.0–0.5)
Eosinophils Relative: 4 %
HCT: 41.4 % (ref 39.0–52.0)
Hemoglobin: 13.4 g/dL (ref 13.0–17.0)
Immature Granulocytes: 0 %
Lymphocytes Relative: 16 %
Lymphs Abs: 1.2 10*3/uL (ref 0.7–4.0)
MCH: 27.1 pg (ref 26.0–34.0)
MCHC: 32.4 g/dL (ref 30.0–36.0)
MCV: 83.6 fL (ref 80.0–100.0)
Monocytes Absolute: 0.7 10*3/uL (ref 0.1–1.0)
Monocytes Relative: 9 %
Neutro Abs: 5.3 10*3/uL (ref 1.7–7.7)
Neutrophils Relative %: 70 %
Platelets: 249 10*3/uL (ref 150–400)
RBC: 4.95 MIL/uL (ref 4.22–5.81)
RDW: 14.5 % (ref 11.5–15.5)
WBC: 7.5 10*3/uL (ref 4.0–10.5)
nRBC: 0 % (ref 0.0–0.2)

## 2023-02-05 LAB — POCT URINE DRUG SCREEN - MANUAL ENTRY (I-SCREEN)
POC Amphetamine UR: NOT DETECTED
POC Buprenorphine (BUP): NOT DETECTED
POC Cocaine UR: NOT DETECTED
POC Marijuana UR: POSITIVE — AB
POC Methadone UR: NOT DETECTED
POC Methamphetamine UR: NOT DETECTED
POC Morphine: NOT DETECTED
POC Oxazepam (BZO): NOT DETECTED
POC Oxycodone UR: NOT DETECTED
POC Secobarbital (BAR): NOT DETECTED

## 2023-02-05 LAB — ETHANOL: Alcohol, Ethyl (B): <10 mg/dL (ref <10)

## 2023-02-05 LAB — MAGNESIUM: Magnesium: 2.1 mg/dL (ref 1.7–2.4)

## 2023-02-05 LAB — RESP PANEL BY RT-PCR (RSV, FLU A&B, COVID)  RVPGX2
Influenza A by PCR: NEGATIVE
Influenza B by PCR: NEGATIVE
Resp Syncytial Virus by PCR: NEGATIVE
SARS Coronavirus 2 by RT PCR: NEGATIVE

## 2023-02-05 LAB — HEMOGLOBIN A1C
Hgb A1c MFr Bld: 5.5 % (ref 4.8–5.6)
Mean Plasma Glucose: 111.15 mg/dL

## 2023-02-05 LAB — COMPREHENSIVE METABOLIC PANEL
ALT: 20 U/L (ref 0–44)
AST: 20 U/L (ref 15–41)
Albumin: 3.4 g/dL — ABNORMAL LOW (ref 3.5–5.0)
Alkaline Phosphatase: 94 U/L (ref 38–126)
Anion gap: 8 (ref 5–15)
BUN: 15 mg/dL (ref 8–23)
CO2: 24 mmol/L (ref 22–32)
Calcium: 8.8 mg/dL — ABNORMAL LOW (ref 8.9–10.3)
Chloride: 102 mmol/L (ref 98–111)
Creatinine, Ser: 1.03 mg/dL (ref 0.61–1.24)
GFR, Estimated: >60 mL/min (ref >60)
Glucose, Bld: 106 mg/dL — ABNORMAL HIGH (ref 70–99)
Potassium: 4.1 mmol/L (ref 3.5–5.1)
Sodium: 134 mmol/L — ABNORMAL LOW (ref 135–145)
Total Bilirubin: 0.8 mg/dL (ref 0.3–1.2)
Total Protein: 6.2 g/dL — ABNORMAL LOW (ref 6.5–8.1)

## 2023-02-05 LAB — POC SARS CORONAVIRUS 2 AG: SARSCOV2ONAVIRUS 2 AG: NEGATIVE

## 2023-02-05 MED ORDER — TRAZODONE HCL 50 MG PO TABS
50.0000 mg | ORAL_TABLET | Freq: Every evening | ORAL | Status: DC | PRN
  Administered 2023-02-05: 50 mg via ORAL
  Filled 2023-02-05: qty 1

## 2023-02-05 MED ORDER — OLANZAPINE 10 MG IM SOLR: 5.0000 mg | Freq: Once | INTRAMUSCULAR | Status: DC | PRN

## 2023-02-05 MED ORDER — QUETIAPINE FUMARATE 25 MG PO TABS: 25.0000 mg | ORAL_TABLET | Freq: Once | ORAL | Status: DC | PRN

## 2023-02-05 MED ORDER — QUETIAPINE FUMARATE 25 MG PO TABS: 25.0000 mg | ORAL_TABLET | Freq: Four times a day (QID) | ORAL | Status: DC | PRN

## 2023-02-05 MED ORDER — BENZTROPINE MESYLATE 0.5 MG PO TABS
0.5000 mg | ORAL_TABLET | Freq: Once | ORAL | Status: AC
  Administered 2023-02-05: 0.5 mg via ORAL
  Filled 2023-02-05: qty 1

## 2023-02-05 MED ORDER — QUETIAPINE FUMARATE 100 MG PO TABS
100.0000 mg | ORAL_TABLET | Freq: Every day | ORAL | Status: DC
  Administered 2023-02-05: 100 mg via ORAL
  Filled 2023-02-05: qty 1

## 2023-02-05 MED ORDER — BENZTROPINE MESYLATE 1 MG/ML IJ SOLN: 1.0000 mg | Freq: Once | INTRAMUSCULAR | Status: DC | PRN

## 2023-02-05 MED ORDER — QUETIAPINE FUMARATE 50 MG PO TABS: 50.0000 mg | ORAL_TABLET | Freq: Every day | ORAL | Status: DC

## 2023-02-05 MED ORDER — ACETAMINOPHEN 325 MG PO TABS: 650.0000 mg | ORAL_TABLET | Freq: Four times a day (QID) | ORAL | Status: DC | PRN

## 2023-02-05 MED ORDER — QUETIAPINE FUMARATE 25 MG PO TABS: 25.0000 mg | ORAL_TABLET | Freq: Two times a day (BID) | ORAL | Status: DC | PRN

## 2023-02-05 MED ORDER — BENZTROPINE MESYLATE 1 MG PO TABS: 1.0000 mg | ORAL_TABLET | Freq: Once | ORAL | Status: DC | PRN

## 2023-02-05 MED ORDER — OLANZAPINE 5 MG PO TBDP: 5.0000 mg | ORAL_TABLET | Freq: Once | ORAL | Status: DC | PRN

## 2023-02-05 MED ORDER — MAGNESIUM HYDROXIDE 400 MG/5ML PO SUSP: 30.0000 mL | Freq: Every day | ORAL | Status: DC | PRN

## 2023-02-05 MED ORDER — ALUM & MAG HYDROXIDE-SIMETH 200-200-20 MG/5ML PO SUSP: 30.0000 mL | ORAL | Status: DC | PRN

## 2023-02-05 MED ORDER — OLANZAPINE 5 MG PO TABS: 5.0000 mg | ORAL_TABLET | Freq: Once | ORAL | Status: DC | PRN

## 2023-02-05 NOTE — ED Notes (Signed)
Provider had ekg

## 2023-02-05 NOTE — ED Notes (Signed)
Patient  sleeping in no acute stress. RR even and unlabored .Environment secured .Will continue to monitor for safely. 

## 2023-02-05 NOTE — ED Notes (Signed)
STAT lab courier called to transport labs to [facility] lab
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
## 2023-02-05 NOTE — ED Notes (Signed)
Pt is taking a stroll in the hallway. Respirations are even and unlabored. No acute distress noted. Will continue to monitor for safety.

## 2023-02-05 NOTE — ED Notes (Signed)
Patient alert and oriented x 3. Denies SI/HI/AVH. Denies intent or plan to harm self or others. Routine conducted according to faculty protocol. Encourage patient to notify staff with any needs or concerns. Patient verbalized agreement and understanding. Will continue to monitor for safety.Pt admitted to obs ,denies SI/HI/AVH. Calm, cooperative throughout interview process. Skin assessment completed. Oriented to unit. Meal and drink offered. At [place], pt continue to denies SI/HI/AVH. Pt verbally contract for safety. Will monitor for safety. Items were put in locker 13 sock shoes hat and watch

## 2023-02-05 NOTE — ED Notes (Signed)
Patient alert and oriented x 3. Denies SI/HI/AVH. Denies intent or plan to harm self or others. Routine conducted according to faculty protocol. Encourage patient to notify staff with any needs or concerns. Patient verbalized agreement and understanding. Will continue to monitor for safety. 

## 2023-02-05 NOTE — ED Notes (Signed)
Patient had a pill in his pants pharmacy states to trow it away in the black ben. It was witness by [employer]

## 2023-02-05 NOTE — ED Provider Notes (Signed)
Patient given Cogentin 0.5 mg orally x 1 to help with EPS.  Subsequent physical exam demonstrated marked improvement in rigidity as well as gait.  Cogentin available as an as needed medication going forward.  Remainder of plan as per progress note.  Corky Sox, MD PGY-2

## 2023-02-05 NOTE — ED Triage Notes (Signed)
Pt presents to [facility] voluntarily accompanied by his sister seeking medication management. Pt states he was admitted to a psychiatric facility in [place] 1 month ago and was prescribed Thorazine. Pt states he runs out of his medication today and is in need of a refill and follow-up care. Pt denies SI/HI and AVH.

## 2023-02-05 NOTE — Discharge Instructions (Addendum)
  Please follow up with: [facility] [address] [place], [place], [address] [phone] phone We were, unfortunately, unable to make an appointment for you despite our best efforts. The office should be open on Monday and able to get you scheduled.   You can also try these other practices that take Medicare: [facility] [address] [place], [place], [address] [phone] phone ([address] [place], [place], [place], [place], [employer], [place], [place], [place], [place], [place], [place], [place], [place], [employer], [place])  [facility] [address] [place], [place], [address] [phone] phone  [facility] [address], [place], [place], [address] [phone] phone  [facility] [address] [place], [place], [address] [phone] phone [phone] fax  [facility], [place] [address], [place], [address] [phone] phone  [facility] [place] [place], [place], [address] [phone] phone [phone] fax  [place] [address] [place], [place], [address] [phone] phone  [facility] [address] [place], [place], [address] [phone] phone  [employer] [place], [place], [address] [phone] phone [phone] fax   Patient is instructed prior to discharge to:  Take all medications as prescribed by his/her mental healthcare provider. Report any adverse effects and or reactions from the medicines to his/her outpatient provider promptly. Keep all scheduled appointments, to ensure that you are getting refills on time and to avoid any interruption in your medication.  If you are unable to keep an appointment call to reschedule.  Be sure to  follow-up with resources and follow-up appointments provided.  Patient has been instructed & cautioned: To not engage in alcohol and or illegal drug use while on prescription medicines. In the event of worsening symptoms, patient is instructed to call the crisis hotline, 911 and or go to the nearest ED for appropriate evaluation and treatment of symptoms. To follow-up with his/her primary care provider for your other medical issues, concerns and or health care needs.  Information: -National Suicide Prevention Lifeline 1-800-SUICIDE or (740)866-2794.  -988 offers 24/7 access to trained crisis counselors who can help people experiencing mental health-related distress. People can call or text 988 or chat 988lifeline.org for themselves or if they are worried about a loved one who may need crisis support.

## 2023-02-06 DIAGNOSIS — R5383 Other fatigue: Secondary | ICD-10-CM | POA: Diagnosis not present

## 2023-02-06 DIAGNOSIS — G259 Extrapyramidal and movement disorder, unspecified: Secondary | ICD-10-CM

## 2023-02-06 DIAGNOSIS — G2119 Other drug induced secondary parkinsonism: Secondary | ICD-10-CM

## 2023-02-06 DIAGNOSIS — Z1152 Encounter for screening for COVID-19: Secondary | ICD-10-CM | POA: Diagnosis not present

## 2023-02-06 DIAGNOSIS — Z8659 Personal history of other mental and behavioral disorders: Secondary | ICD-10-CM | POA: Diagnosis not present

## 2023-02-06 DIAGNOSIS — R69 Illness, unspecified: Secondary | ICD-10-CM | POA: Diagnosis not present

## 2023-02-06 LAB — PROLACTIN: Prolactin: 12.7 ng/mL (ref 3.6–25.2)

## 2023-02-06 MED ORDER — FINASTERIDE 5 MG PO TABS
5.0000 mg | ORAL_TABLET | Freq: Every day | ORAL | Status: DC
  Filled 2023-02-06: qty 1

## 2023-02-06 MED ORDER — QUETIAPINE FUMARATE 100 MG PO TABS: 100.0000 mg | ORAL_TABLET | Freq: Every day | ORAL | 0 refills | Status: AC

## 2023-02-06 MED ORDER — TAMSULOSIN HCL 0.4 MG PO CAPS: 0.4000 mg | ORAL_CAPSULE | Freq: Every day | ORAL | Status: DC

## 2023-02-06 MED ORDER — ATORVASTATIN CALCIUM 10 MG PO TABS: 20.0000 mg | ORAL_TABLET | Freq: Every day | ORAL | Status: DC

## 2023-02-06 NOTE — ED Notes (Signed)
Pt is in the bed sleeping. Respirations are even and unlabored. No acute distress noted. Will continue to monitor for safety. 

## 2023-02-06 NOTE — ED Notes (Signed)
Assumed care of patient at this time. Pt is resting quietly . Respirations even and unlabored.

## 2023-02-06 NOTE — Progress Notes (Signed)
Order received for discharge. Patient denies any SI HI or AVH. All after care discharge instructions reviewed as well as crisis care. Pt aware of new RX being electronically sent. Pt declined to take am meds scheduled as he states '' I'll just take them when I get home. '' Pt ambulatory to lobby to wait for ride from family.

## 2023-02-06 NOTE — Progress Notes (Signed)
Pt is pleasant cooperative, somewhat anxious. Pt denies any AH or VH states '' I'm here because I was hoping they could get my medication right. That thorazine had me so tired and stiff. ''  Pt denies any VH or AH . Pt offered nutrigrain bar and juice. Has been ambulatory on the unit and tending to his own adl's. Pt is safe. Will con't to monitor.
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
## 2023-02-24 DIAGNOSIS — R69 Illness, unspecified: Secondary | ICD-10-CM | POA: Diagnosis not present

## 2023-02-24 DIAGNOSIS — F411 Generalized anxiety disorder: Secondary | ICD-10-CM | POA: Diagnosis not present

## 2023-02-24 DIAGNOSIS — F3342 Major depressive disorder, recurrent, in full remission: Secondary | ICD-10-CM | POA: Diagnosis not present

## 2023-02-24 DIAGNOSIS — G2401 Drug induced subacute dyskinesia: Secondary | ICD-10-CM | POA: Diagnosis not present

## 2023-03-11 DIAGNOSIS — F411 Generalized anxiety disorder: Secondary | ICD-10-CM | POA: Diagnosis not present

## 2023-03-11 DIAGNOSIS — F3342 Major depressive disorder, recurrent, in full remission: Secondary | ICD-10-CM | POA: Diagnosis not present

## 2023-03-11 DIAGNOSIS — R69 Illness, unspecified: Secondary | ICD-10-CM | POA: Diagnosis not present

## 2023-03-30 DIAGNOSIS — F411 Generalized anxiety disorder: Secondary | ICD-10-CM | POA: Diagnosis not present

## 2023-03-30 DIAGNOSIS — G2401 Drug induced subacute dyskinesia: Secondary | ICD-10-CM | POA: Diagnosis not present

## 2023-03-30 DIAGNOSIS — F3342 Major depressive disorder, recurrent, in full remission: Secondary | ICD-10-CM | POA: Diagnosis not present

## 2023-03-30 DIAGNOSIS — R69 Illness, unspecified: Secondary | ICD-10-CM | POA: Diagnosis not present

## 2023-04-06 ENCOUNTER — Ambulatory Visit: Payer: Medicare HMO | Admitting: Neurology

## 2023-04-06 ENCOUNTER — Encounter: Payer: Self-pay | Admitting: Neurology

## 2023-04-06 VITALS — BP 145/79 | HR 50 | Ht 67.0 in | Wt 141.5 lb

## 2023-04-06 DIAGNOSIS — M6289 Other specified disorders of muscle: Secondary | ICD-10-CM | POA: Diagnosis not present

## 2023-04-06 NOTE — Progress Notes (Signed)
Chief Complaint  Patient presents with   New Patient (Initial Visit)    Rm 13. Alone. NP internal referral for Drug-induced Parkinson's.      ASSESSMENT AND PLAN  Thomas Mcbride is a 77 y.o. male   Stiffness  Improved after lower dose of Seroquel 50 mg daily,   No significant parkinsonian features on examination,  Encourage patient moderate exercise, return to clinic for worsening symptoms,  DIAGNOSTIC DATA (LABS, IMAGING, TESTING) - I reviewed patient records, labs, notes, testing and imaging myself where available.   MEDICAL HISTORY:  Thomas Mcbride is a 77 year old male, seen in request by his primary care physician Dr. Andrey Campanile, Lauris Poag for evaluation of stiffness, initial evaluation April 06, 2023  I reviewed and summarized the referring note. 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 Since Seroquel dosage was cut back from 100 to 50 mg daily, his symptoms has much improved  He denies hand tremor, denies REM sleep disorder, no loss sense of smell, no family history of Parkinson's disease,   PHYSICAL EXAM:   Vitals:   04/06/23 1552  BP: (!) 145/79  Pulse: (!) 50  Weight: 141 lb 8 oz (64.2 kg)  Height:  (1.702 m)   Not recorded     Body mass index is 22.16 kg/m.  PHYSICAL EXAMNIATION:  Gen: NAD, conversant, well nourised, well groomed                     Cardiovascular: Regular rate rhythm, no peripheral edema, warm,  nontender. Eyes: Conjunctivae clear without exudates or hemorrhage Neck: Supple, no carotid bruits. Pulmonary: Clear to auscultation bilaterally   NEUROLOGICAL EXAM:  MENTAL STATUS: Speech/cognition: Awake, alert, oriented to history taking and casual conversation CRANIAL NERVES: CN II: Visual fields are full to confrontation. Pupils are round equal and briskly reactive to light. CN III, IV, VI: extraocular movement are normal. No ptosis. CN V: Facial sensation is intact to light touch CN VII: Face is symmetric with normal eye closure  CN VIII: Hearing is normal to causal conversation. CN IX, X: Phonation is normal. CN XI: Head turning and shoulder shrug are intact  MOTOR: No resting tremor, normal strength, no significant rigidity, bradykinesia,  REFLEXES: Reflexes are 2+ and symmetric at the biceps, triceps, knees, and ankles. Plantar responses are flexor.  SENSORY: Intact to light touch, pinprick and vibratory sensation are intact in fingers and toes.  COORDINATION: There is no trunk or limb dysmetria noted.  GAIT/STANCE: Posture is normal. Gait is steady with moderate stride REVIEW OF SYSTEMS:  Full 14 system review of systems performed and notable only for as above All other review of systems were negative.   ALLERGIES: No Known Allergies  HOME MEDICATIONS: Current Outpatient Medications  Medication Sig Dispense Refill   atorvastatin (LIPITOR) 20 MG tablet Take 1 tablet (20 mg total) by mouth daily. 90 tablet 3   Coenzyme Q10 (CO Q-10) 100 MG CAPS Take 1 tablet by mouth 3 (three) times a week.  Takes on Tuesdays, Thursdays and Saturdays.     finasteride (PROSCAR) 5 MG tablet Take 5 mg by mouth daily.     FLUoxetine (PROZAC) 10 MG capsule Take 25 mg by mouth daily.     GLUCOSAMINE-CHONDROITIN PO Take 1 tablet by mouth daily as needed (Occasionally when working out (4-6 monthly)).     QUEtiapine (SEROQUEL) 100 MG tablet Take 1 tablet (100 mg total) by mouth at  bedtime. 30 tablet 0   tamsulosin (FLOMAX) 0.4 MG CAPS capsule Take 1 capsule (0.4 mg total) by mouth daily. (Patient taking differently: Take 0.4 mg by mouth in the morning and at bedtime.) 90 capsule 3   No current facility-administered medications for this visit.    PAST MEDICAL HISTORY: Past Medical History:  Diagnosis Date   Allergy    Attention deficit hyperactivity disorder (ADHD)    BPH (benign prostatic hyperplasia)    Depression    Hyperlipidemia     PAST SURGICAL HISTORY: Past Surgical History:  Procedure Laterality Date   FRACTURE SURGERY  1972   leg   VASECTOMY      FAMILY HISTORY: Family History  Problem Relation Age of Onset   Crohn's disease Mother    Cancer Mother        brain   Heart attack Father    Lymphoma Father    Cancer Father        marrow   Heart disease Father    Hyperlipidemia Father    Hypertension Father        ?   COPD Paternal Grandfather    Hypertension Sister     SOCIAL HISTORY: Social History   Socioeconomic History   Marital status: Divorced    Spouse name: Not on file   Number of children: 2   Years of education: Not on file   Highest education level: Not on file  Occupational History   Occupation: Quarry manager ASSOCIATE    Employer: [employer]  Tobacco Use   Smoking status: Never   Smokeless tobacco: Never  Vaping Use   Vaping Use: Never used  Substance and Sexual Activity   Alcohol use: No   Drug use: No   Sexual activity: Not Currently    Birth control/protection: None  Other Topics Concern   Not on file  Social History Narrative   Exercises 3 x's week on eliptical machine.         Social Determinants of Health   Financial Resource Strain: Not on file  Food Insecurity: Not on file  Transportation Needs: Not on file  Physical Activity: Not on file  Stress: Not on file  Social Connections: Not on file  Intimate Partner Violence: Not on file      Levert Feinstein, M.D. Ph.D.  [facility] [address] [place], [place] [address] Ph: [phone] Fax: [phone]  CC:  Georganna Skeans, MD [address] [place],  [place] [address]  Georganna Skeans, MD

## 2023-04-27 DIAGNOSIS — F3342 Major depressive disorder, recurrent, in full remission: Secondary | ICD-10-CM | POA: Diagnosis not present

## 2023-04-27 DIAGNOSIS — F411 Generalized anxiety disorder: Secondary | ICD-10-CM | POA: Diagnosis not present

## 2023-06-08 DIAGNOSIS — F3342 Major depressive disorder, recurrent, in full remission: Secondary | ICD-10-CM | POA: Diagnosis not present

## 2023-06-08 DIAGNOSIS — F411 Generalized anxiety disorder: Secondary | ICD-10-CM | POA: Diagnosis not present

## 2023-08-03 DIAGNOSIS — F411 Generalized anxiety disorder: Secondary | ICD-10-CM | POA: Diagnosis not present

## 2023-08-03 DIAGNOSIS — G2401 Drug induced subacute dyskinesia: Secondary | ICD-10-CM | POA: Diagnosis not present

## 2023-08-03 DIAGNOSIS — F3342 Major depressive disorder, recurrent, in full remission: Secondary | ICD-10-CM | POA: Diagnosis not present

## 2023-10-27 DIAGNOSIS — F411 Generalized anxiety disorder: Secondary | ICD-10-CM | POA: Diagnosis not present

## 2023-10-27 DIAGNOSIS — F3342 Major depressive disorder, recurrent, in full remission: Secondary | ICD-10-CM | POA: Diagnosis not present

## 2023-11-02 DIAGNOSIS — Z008 Encounter for other general examination: Secondary | ICD-10-CM | POA: Diagnosis not present

## 2023-11-03 ENCOUNTER — Ambulatory Visit (INDEPENDENT_AMBULATORY_CARE_PROVIDER_SITE_OTHER): Payer: Medicare HMO | Admitting: Family Medicine

## 2023-11-03 ENCOUNTER — Encounter: Payer: Self-pay | Admitting: Family Medicine

## 2023-11-03 VITALS — BP 150/82 | HR 56 | Temp 98.0°F | Resp 18 | Ht 67.0 in | Wt 141.4 lb

## 2023-11-03 DIAGNOSIS — N400 Enlarged prostate without lower urinary tract symptoms: Secondary | ICD-10-CM

## 2023-11-03 DIAGNOSIS — E785 Hyperlipidemia, unspecified: Secondary | ICD-10-CM

## 2023-11-03 DIAGNOSIS — I10 Essential (primary) hypertension: Secondary | ICD-10-CM

## 2023-11-03 MED ORDER — ATORVASTATIN CALCIUM 20 MG PO TABS: 20.0000 mg | ORAL_TABLET | Freq: Every day | ORAL | 3 refills | Status: DC

## 2023-11-04 ENCOUNTER — Encounter: Payer: Self-pay | Admitting: Family Medicine

## 2023-11-04 DIAGNOSIS — F411 Generalized anxiety disorder: Secondary | ICD-10-CM | POA: Diagnosis not present

## 2023-11-04 DIAGNOSIS — F3342 Major depressive disorder, recurrent, in full remission: Secondary | ICD-10-CM | POA: Diagnosis not present

## 2023-11-04 NOTE — Progress Notes (Signed)
Established Patient Office Visit  Subjective    Patient ID: Thomas Mcbride, male    DOB: 02/22/1946  Age: 77 y.o. MRN: [id number]  CC:  Chief Complaint  Patient presents with   Follow-up    Medication refill    HPI Thomas Mcbride presents for follow up of chronic med issues including hypertension and BPH. Patient reports taking meds as recommended and denies acute complaints.   Outpatient Encounter Medications as of 11/03/2023  Medication Sig   Coenzyme Q10 (CO Q-10) 100 MG CAPS Take 1 tablet by mouth 3 (three) times a week. Takes on Tuesdays, Thursdays and Saturdays.   finasteride (PROSCAR) 5 MG tablet Take 5 mg by mouth daily.   FLUoxetine (PROZAC) 10 MG capsule Take 25 mg by mouth daily.   GLUCOSAMINE-CHONDROITIN PO Take 1 tablet by mouth daily as needed (Occasionally when working out (4-6 monthly)).   QUEtiapine (SEROQUEL) 100 MG tablet Take 1 tablet (100 mg total) by mouth at bedtime.   tamsulosin (FLOMAX) 0.4 MG CAPS capsule Take 1 capsule (0.4 mg total) by mouth daily. (Patient taking differently: Take 0.4 mg by mouth in the morning and at bedtime.)   [DISCONTINUED] atorvastatin (LIPITOR) 20 MG tablet Take 1 tablet (20 mg total) by mouth daily.   atorvastatin (LIPITOR) 20 MG tablet Take 1 tablet (20 mg total) by mouth daily.   No facility-administered encounter medications on file as of 11/03/2023.    Past Medical History:  Diagnosis Date   Allergy    Attention deficit hyperactivity disorder (ADHD)    BPH (benign prostatic hyperplasia)    Depression    Hyperlipidemia     Past Surgical History:  Procedure Laterality Date   FRACTURE SURGERY  1972   leg   VASECTOMY      Family History  Problem Relation Age of Onset   Crohn's disease Mother    Cancer Mother        brain   Heart attack Father    Lymphoma Father    Cancer Father        marrow   Heart disease Father    Hyperlipidemia Father    Hypertension Father        ?   COPD Paternal Grandfather     Hypertension Sister     Social History   Socioeconomic History   Marital status: Divorced    Spouse name: Not on file   Number of children: 2   Years of education: Not on file   Highest education level: Not on file  Occupational History   Occupation: Quarry manager ASSOCIATE    Employer: [employer]  Tobacco Use   Smoking status: Never   Smokeless tobacco: Never  Vaping Use   Vaping status: Never Used  Substance and Sexual Activity   Alcohol use: No   Drug use: No   Sexual activity: Not Currently    Birth control/protection: None  Other Topics Concern   Not on file  Social History Narrative   Exercises 3 x's week on eliptical machine.         Social Determinants of Health   Financial Resource Strain: Low Risk  (11/03/2023)   Overall Financial Resource Strain (CARDIA)    Difficulty of Paying Living Expenses: Not hard at all  Food Insecurity: No Food Insecurity (11/03/2023)   Hunger Vital Sign    Worried About Running Out of Food in the Last Year: Never true    Ran Out of Food in the Last Year: Never true  Transportation Needs: No Transportation Needs (11/03/2023)   PRAPARE - Administrator, Civil Service (Medical): No    Lack of Transportation (Non-Medical): No  Physical Activity: Sufficiently Active (11/03/2023)   Exercise Vital Sign    Days of Exercise per Week: 5 days    Minutes of Exercise per Session: 30 min  Stress: No Stress Concern Present (11/03/2023)   [employer] of Occupational Health - Occupational Stress Questionnaire    Feeling of Stress : Not at all  Social Connections: Moderately Integrated (11/03/2023)   Social Connection and Isolation Panel [NHANES]    Frequency of Communication with Friends and Family: More than three times a week    Frequency of Social Gatherings with Friends and Family: Once a week    Attends Religious Services: More than 4 times per year    Active Member of [employer] or Organizations: Yes    Attends Tax inspector Meetings: More than 4 times per year    Marital Status: Divorced  Intimate Partner Violence: Not At Risk (11/03/2023)   Humiliation, Afraid, Rape, and Kick questionnaire    Fear of Current or Ex-Partner: No    Emotionally Abused: No    Physically Abused: No    Sexually Abused: No    Review of Systems  All other systems reviewed and are negative.       Objective    BP (!) 150/82 (BP Location: Right Arm, Patient Position: Sitting, Cuff Size: Normal)   Pulse (!) 56   Temp 98 F (36.7 C) (Oral)   Resp 18   Ht 5\' 7"  (1.702 m)   Wt 141 lb 6.4 oz (64.1 kg)   SpO2 98%   BMI 22.15 kg/m   Physical Exam Vitals and nursing note reviewed.  Constitutional:      General: He is not in acute distress. Cardiovascular:     Rate and Rhythm: Normal rate and regular rhythm.  Pulmonary:     Effort: Pulmonary effort is normal.     Breath sounds: Normal breath sounds.  Abdominal:     Palpations: Abdomen is soft.     Tenderness: There is no abdominal tenderness.  Neurological:     General: No focal deficit present.     Mental Status: He is alert and oriented to person, place, and time.         Assessment & Plan:   Essential hypertension  Hyperlipidemia, unspecified hyperlipidemia type -     Atorvastatin Calcium; Take 1 tablet (20 mg total) by mouth daily.  Dispense: 90 tablet; Refill: 3  Benign prostatic hyperplasia without lower urinary tract symptoms     Return in about 6 months (around 05/02/2024) for follow up.   Tommie Raymond, MD

## 2023-12-16 DIAGNOSIS — F600 Paranoid personality disorder: Secondary | ICD-10-CM | POA: Diagnosis not present

## 2023-12-16 DIAGNOSIS — F22 Delusional disorders: Secondary | ICD-10-CM | POA: Diagnosis not present

## 2023-12-17 DIAGNOSIS — F22 Delusional disorders: Secondary | ICD-10-CM | POA: Diagnosis not present

## 2023-12-17 DIAGNOSIS — R001 Bradycardia, unspecified: Secondary | ICD-10-CM | POA: Diagnosis not present

## 2023-12-17 DIAGNOSIS — F600 Paranoid personality disorder: Secondary | ICD-10-CM | POA: Diagnosis not present

## 2023-12-17 DIAGNOSIS — I444 Left anterior fascicular block: Secondary | ICD-10-CM | POA: Diagnosis not present

## 2023-12-18 DIAGNOSIS — F4310 Post-traumatic stress disorder, unspecified: Secondary | ICD-10-CM | POA: Diagnosis not present

## 2023-12-18 DIAGNOSIS — E785 Hyperlipidemia, unspecified: Secondary | ICD-10-CM | POA: Diagnosis not present

## 2023-12-18 DIAGNOSIS — F600 Paranoid personality disorder: Secondary | ICD-10-CM | POA: Diagnosis not present

## 2023-12-18 DIAGNOSIS — F22 Delusional disorders: Secondary | ICD-10-CM | POA: Diagnosis not present

## 2023-12-18 DIAGNOSIS — F332 Major depressive disorder, recurrent severe without psychotic features: Secondary | ICD-10-CM | POA: Diagnosis not present

## 2023-12-18 DIAGNOSIS — Z7901 Long term (current) use of anticoagulants: Secondary | ICD-10-CM | POA: Diagnosis not present

## 2023-12-18 DIAGNOSIS — Z62810 Personal history of physical and sexual abuse in childhood: Secondary | ICD-10-CM | POA: Diagnosis not present

## 2023-12-18 DIAGNOSIS — N400 Enlarged prostate without lower urinary tract symptoms: Secondary | ICD-10-CM | POA: Diagnosis not present

## 2023-12-18 DIAGNOSIS — F209 Schizophrenia, unspecified: Secondary | ICD-10-CM | POA: Diagnosis not present

## 2023-12-18 NOTE — ED Provider Notes (Signed)
 EM Observation Re-evaluation note  Thomas Mcbride is a 78 y.o. male, seen on rounds today.  The patient presented with a chief complaint of paranoia.   Currently, the patient is resting.     Temp:  [97.7 F (36.5 C)] 97.7 F (36.5 C) Heart Rate:  [56-57] 56 Resp:  [16-17] 16 BP: (134-143)/(66-85) 140/66 SpO2:  [97 %-98 %] 97 %  Psychiatric examination: calm, resting comfortably  MDM:  I have reviewed the labs performed to date as well as medications administered while in observation. Recent changes in the last 24 hours include none.  Current plan is for Patient to be transferred to [facility] for inpatient [facility] admission.  Patient is not under full IVC at this time.   Pt did become agitated and required medications. Pt is pending transfer.    This document serves as a record of services personally performed by Leita Phenix, MD. It was created on their behalf by Deatrice VEAR Bathe, Scribe, a trained medical scribe. The creation of this record is the provider's dictation and/or activities during the visit.   Electronically signed by: Deatrice VEAR Bathe, Scribe 12/18/2023 7:22 AM  I agree the documentation is accurate and complete. Reviewed by: Leita Phenix, MD 12/18/2023 7:22 AM

## 2023-12-20 ENCOUNTER — Telehealth: Payer: Self-pay | Admitting: Family Medicine

## 2023-12-20 NOTE — Telephone Encounter (Signed)
 Marland Kitchen

## 2023-12-22 ENCOUNTER — Telehealth: Payer: Self-pay | Admitting: Family Medicine

## 2023-12-22 NOTE — Telephone Encounter (Signed)
 Thomas Mcbride

## 2023-12-28 DIAGNOSIS — F332 Major depressive disorder, recurrent severe without psychotic features: Secondary | ICD-10-CM | POA: Diagnosis not present

## 2023-12-29 ENCOUNTER — Telehealth: Payer: Self-pay | Admitting: Family Medicine

## 2023-12-29 DIAGNOSIS — F411 Generalized anxiety disorder: Secondary | ICD-10-CM | POA: Diagnosis not present

## 2023-12-29 DIAGNOSIS — G2401 Drug induced subacute dyskinesia: Secondary | ICD-10-CM | POA: Diagnosis not present

## 2023-12-29 DIAGNOSIS — F200 Paranoid schizophrenia: Secondary | ICD-10-CM | POA: Diagnosis not present

## 2023-12-29 NOTE — Telephone Encounter (Signed)
 Thomas Mcbride

## 2024-01-12 ENCOUNTER — Inpatient Hospital Stay: Payer: Medicare HMO | Admitting: Family Medicine

## 2024-01-12 ENCOUNTER — Telehealth: Payer: Self-pay | Admitting: Family Medicine

## 2024-01-12 NOTE — Telephone Encounter (Signed)
Called patient to reschedule missed appointment no answer left voicemail

## 2024-01-26 DIAGNOSIS — F200 Paranoid schizophrenia: Secondary | ICD-10-CM | POA: Diagnosis not present

## 2024-01-26 DIAGNOSIS — G2401 Drug induced subacute dyskinesia: Secondary | ICD-10-CM | POA: Diagnosis not present

## 2024-01-26 DIAGNOSIS — F3342 Major depressive disorder, recurrent, in full remission: Secondary | ICD-10-CM | POA: Diagnosis not present

## 2024-01-26 DIAGNOSIS — F411 Generalized anxiety disorder: Secondary | ICD-10-CM | POA: Diagnosis not present

## 2024-03-03 ENCOUNTER — Ambulatory Visit: Payer: Self-pay

## 2024-03-03 NOTE — Telephone Encounter (Signed)
  Chief Complaint: Possible medication side effects Symptoms: dry mouth, nervous energy Frequency: last couple of weeks Pertinent Negatives: Patient denies SOB, CP Disposition: [] ED /[] Urgent Care (no appt availability in office) / [] Appointment(In office/virtual)/ []  [facility]/ [] Home Care/ [] Refused Recommended Disposition /[]  Mobile Bus/ [x]  Follow-up with PCP Additional Notes: patient called in for possible side effects from medications. Patient reports dry mouth and nervous energy. Patient was wanting to be seen in the office to discuss potential side effects. Patient states he isn't taking any new medications that could be causing symptoms. Patient is asking to be called for a follow up. Patient verbalized understanding and all questions answered.    Copied from CRM [id number]. Topic: Appointments - Appointment Scheduling >> Mar 03, 2024  3:05 PM Emylou G wrote: Dry mouth? Reactions to meds.. worsening symptoms.. including sleep.Marland Kitchen Pls call [phone] Reason for Disposition  [1] Caller has NON-URGENT medicine question about med that PCP prescribed AND [2] triager unable to answer question  Answer Assessment - Initial Assessment Questions 1. NAME of MEDICINE: "What medicine(s) are you calling about?"     Medication list 2. QUESTION: "What is your question?" (e.g., double dose of medicine, side effect)     Patient is wanting to speak with provider about his medication regime. He is having side effects that he would like to speak to her about 3. PRESCRIBER: "Who prescribed the medicine?" Reason: if prescribed by specialist, call should be referred to that group.     Georganna Skeans MD 4. SYMPTOMS: "Do you have any symptoms?" If Yes, ask: "What symptoms are you having?"  "How bad are the symptoms (e.g., mild, moderate, severe)     Dry mouth, nervous energy  Protocols used: Medication Question Call-A-AH

## 2024-03-03 NOTE — Telephone Encounter (Signed)
 I called patient back twice and no one answered so I left a voicemail to return my call

## 2024-03-08 DIAGNOSIS — F411 Generalized anxiety disorder: Secondary | ICD-10-CM | POA: Diagnosis not present

## 2024-03-08 DIAGNOSIS — F200 Paranoid schizophrenia: Secondary | ICD-10-CM | POA: Diagnosis not present

## 2024-03-08 DIAGNOSIS — G2401 Drug induced subacute dyskinesia: Secondary | ICD-10-CM | POA: Diagnosis not present

## 2024-04-04 DIAGNOSIS — F3342 Major depressive disorder, recurrent, in full remission: Secondary | ICD-10-CM | POA: Diagnosis not present

## 2024-04-04 DIAGNOSIS — F200 Paranoid schizophrenia: Secondary | ICD-10-CM | POA: Diagnosis not present

## 2024-04-04 DIAGNOSIS — F411 Generalized anxiety disorder: Secondary | ICD-10-CM | POA: Diagnosis not present

## 2024-04-04 DIAGNOSIS — G2401 Drug induced subacute dyskinesia: Secondary | ICD-10-CM | POA: Diagnosis not present

## 2024-05-02 ENCOUNTER — Ambulatory Visit (INDEPENDENT_AMBULATORY_CARE_PROVIDER_SITE_OTHER): Payer: Medicare HMO | Admitting: Family Medicine

## 2024-05-02 ENCOUNTER — Encounter: Payer: Self-pay | Admitting: Family Medicine

## 2024-05-02 VITALS — BP 129/73 | HR 48 | Temp 98.7°F | Resp 16 | Ht 67.0 in | Wt 138.0 lb

## 2024-05-02 DIAGNOSIS — E785 Hyperlipidemia, unspecified: Secondary | ICD-10-CM

## 2024-05-02 DIAGNOSIS — R3911 Hesitancy of micturition: Secondary | ICD-10-CM | POA: Diagnosis not present

## 2024-05-02 DIAGNOSIS — N401 Enlarged prostate with lower urinary tract symptoms: Secondary | ICD-10-CM

## 2024-05-02 DIAGNOSIS — I10 Essential (primary) hypertension: Secondary | ICD-10-CM

## 2024-05-02 MED ORDER — TAMSULOSIN HCL 0.4 MG PO CAPS: 0.4000 mg | ORAL_CAPSULE | Freq: Every day | ORAL | 3 refills | Status: DC

## 2024-05-02 MED ORDER — ATORVASTATIN CALCIUM 20 MG PO TABS: 20.0000 mg | ORAL_TABLET | Freq: Every day | ORAL | 3 refills | Status: AC

## 2024-05-02 NOTE — Progress Notes (Signed)
 Patient is here for their 6 month follow-up Patient has no concerns today Care gaps have been discussed with patient

## 2024-05-02 NOTE — Progress Notes (Signed)
 Established Patient Office Visit  Subjective    Patient ID: Thomas Mcbride, male    DOB: [birth year]  Age: 78 y.o. MRN: [id number]  CC:  Chief Complaint  Patient presents with   Medical Management of Chronic Issues    HPI Thomas Mcbride presents for routine follow up of chronic med issues including hypertension and BPH. Patient reports med compliance and denies acute complaints.   Outpatient Encounter Medications as of 05/02/2024  Medication Sig   benztropine  (COGENTIN ) 0.5 MG tablet Take 0.5 mg by mouth at bedtime.   benztropine  (COGENTIN ) 1 MG tablet Take 1 mg by mouth at bedtime.   Fluoxetine  HCl, PMDD, 10 MG TABS Take 10 mg by mouth.   hydrOXYzine (VISTARIL) 25 MG capsule Take 25-50 mg by mouth at bedtime as needed.   risperiDONE  (RISPERDAL ) 3 MG tablet Take 3 mg by mouth at bedtime.   atorvastatin  (LIPITOR) 20 MG tablet Take 1 tablet (20 mg total) by mouth daily.   Coenzyme Q10 (CO Q-10) 100 MG CAPS Take 1 tablet by mouth 3 (three) times a week. Takes on Tuesdays, Thursdays and Saturdays.   Ferrous Sulfate (IRON PO) Take 1 tablet by mouth every other day.   finasteride  (PROSCAR ) 5 MG tablet Take 5 mg by mouth daily.   FLUoxetine  (PROZAC ) 10 MG capsule Take 25 mg by mouth daily.   GLUCOSAMINE-CHONDROITIN PO Take 1 tablet by mouth daily as needed (Occasionally when working out (4-6 monthly)).   QUEtiapine  (SEROQUEL ) 100 MG tablet Take 1 tablet (100 mg total) by mouth at bedtime.   tamsulosin  (FLOMAX ) 0.4 MG CAPS capsule Take 1 capsule (0.4 mg total) by mouth daily.   [DISCONTINUED] atorvastatin  (LIPITOR) 20 MG tablet Take 1 tablet (20 mg total) by mouth daily.   [DISCONTINUED] tamsulosin  (FLOMAX ) 0.4 MG CAPS capsule Take 1 capsule (0.4 mg total) by mouth daily. (Patient taking differently: Take 0.4 mg by mouth in the morning and at bedtime.)   No facility-administered encounter medications on file as of 05/02/2024.    Past Medical History:  Diagnosis Date   Allergy    Attention  deficit hyperactivity disorder (ADHD)    BPH (benign prostatic hyperplasia)    Depression    Hyperlipidemia     Past Surgical History:  Procedure Laterality Date   FRACTURE SURGERY  1972   leg   VASECTOMY      Family History  Problem Relation Age of Onset   Crohn's disease Mother    Cancer Mother        brain   Heart attack Father    Lymphoma Father    Cancer Father        marrow   Heart disease Father    Hyperlipidemia Father    Hypertension Father        ?   COPD Paternal Grandfather    Hypertension Sister     Social History   Socioeconomic History   Marital status: Divorced    Spouse name: Not on file   Number of children: 2   Years of education: Not on file   Highest education level: Not on file  Occupational History   Occupation: Quarry manager ASSOCIATE    Employer: [employer]  Tobacco Use   Smoking status: Never   Smokeless tobacco: Never  Vaping Use   Vaping status: Never Used  Substance and Sexual Activity   Alcohol use: No   Drug use: No   Sexual activity: Not Currently    Birth control/protection: None  Other  Topics Concern   Not on file  Social History Narrative   Exercises 3 x's week on eliptical machine.         Social Drivers of Corporate investment banker Strain: Low Risk  (11/03/2023)   Overall Financial Resource Strain (CARDIA)    Difficulty of Paying Living Expenses: Not hard at all  Food Insecurity: No Food Insecurity (11/03/2023)   Hunger Vital Sign    Worried About Running Out of Food in the Last Year: Never true    Ran Out of Food in the Last Year: Never true  Transportation Needs: No Transportation Needs (11/03/2023)   PRAPARE - Administrator, Civil Service (Medical): No    Lack of Transportation (Non-Medical): No  Physical Activity: Sufficiently Active (11/03/2023)   Exercise Vital Sign    Days of Exercise per Week: 5 days    Minutes of Exercise per Session: 30 min  Stress: No Stress Concern Present  (11/03/2023)   [employer] of Occupational Health - Occupational Stress Questionnaire    Feeling of Stress : Not at all  Social Connections: Moderately Integrated (11/03/2023)   Social Connection and Isolation Panel [NHANES]    Frequency of Communication with Friends and Family: More than three times a week    Frequency of Social Gatherings with Friends and Family: Once a week    Attends Religious Services: More than 4 times per year    Active Member of [employer] or Organizations: Yes    Attends Banker Meetings: More than 4 times per year    Marital Status: Divorced  Intimate Partner Violence: Not At Risk (11/03/2023)   Humiliation, Afraid, Rape, and Kick questionnaire    Fear of Current or Ex-Partner: No    Emotionally Abused: No    Physically Abused: No    Sexually Abused: No    Review of Systems  All other systems reviewed and are negative.       Objective    BP 129/73   Pulse (!) 48   Temp 98.7 F (37.1 C) (Oral)   Resp 16   Ht 5\' 7"  (1.702 m)   Wt 138 lb (62.6 kg)   SpO2 96%   BMI 21.61 kg/m   Physical Exam Vitals and nursing note reviewed.  Constitutional:      General: He is not in acute distress. Cardiovascular:     Rate and Rhythm: Normal rate and regular rhythm.  Pulmonary:     Effort: Pulmonary effort is normal.     Breath sounds: Normal breath sounds.  Abdominal:     Palpations: Abdomen is soft.     Tenderness: There is no abdominal tenderness.  Neurological:     General: No focal deficit present.     Mental Status: He is alert and oriented to person, place, and time.         Assessment & Plan:   Essential hypertension  Hyperlipidemia, unspecified hyperlipidemia type -     Atorvastatin  Calcium ; Take 1 tablet (20 mg total) by mouth daily.  Dispense: 90 tablet; Refill: 3  Benign prostatic hyperplasia with urinary hesitancy -     Tamsulosin  HCl; Take 1 capsule (0.4 mg total) by mouth daily.  Dispense: 90 capsule; Refill:  3     Return in about 6 months (around 11/02/2024) for follow up, chronic med issues.   Thomas Lama, MD

## 2024-05-04 ENCOUNTER — Encounter: Payer: Self-pay | Admitting: Family Medicine

## 2024-05-11 DIAGNOSIS — G2401 Drug induced subacute dyskinesia: Secondary | ICD-10-CM | POA: Diagnosis not present

## 2024-05-11 DIAGNOSIS — F3342 Major depressive disorder, recurrent, in full remission: Secondary | ICD-10-CM | POA: Diagnosis not present

## 2024-05-11 DIAGNOSIS — F411 Generalized anxiety disorder: Secondary | ICD-10-CM | POA: Diagnosis not present

## 2024-05-11 DIAGNOSIS — F200 Paranoid schizophrenia: Secondary | ICD-10-CM | POA: Diagnosis not present

## 2024-07-11 DIAGNOSIS — F411 Generalized anxiety disorder: Secondary | ICD-10-CM | POA: Diagnosis not present

## 2024-07-11 DIAGNOSIS — F3342 Major depressive disorder, recurrent, in full remission: Secondary | ICD-10-CM | POA: Diagnosis not present

## 2024-07-11 DIAGNOSIS — F200 Paranoid schizophrenia: Secondary | ICD-10-CM | POA: Diagnosis not present

## 2024-07-11 DIAGNOSIS — G2401 Drug induced subacute dyskinesia: Secondary | ICD-10-CM | POA: Diagnosis not present

## 2024-07-31 DIAGNOSIS — F3342 Major depressive disorder, recurrent, in full remission: Secondary | ICD-10-CM | POA: Diagnosis not present

## 2024-07-31 DIAGNOSIS — G2401 Drug induced subacute dyskinesia: Secondary | ICD-10-CM | POA: Diagnosis not present

## 2024-07-31 DIAGNOSIS — F200 Paranoid schizophrenia: Secondary | ICD-10-CM | POA: Diagnosis not present

## 2024-07-31 DIAGNOSIS — F411 Generalized anxiety disorder: Secondary | ICD-10-CM | POA: Diagnosis not present

## 2024-08-08 ENCOUNTER — Encounter: Payer: Self-pay | Admitting: Family Medicine

## 2024-08-08 ENCOUNTER — Ambulatory Visit (INDEPENDENT_AMBULATORY_CARE_PROVIDER_SITE_OTHER): Admitting: Family Medicine

## 2024-08-08 VITALS — BP 128/77 | HR 54 | Ht 67.0 in | Wt 132.2 lb

## 2024-08-08 DIAGNOSIS — G2581 Restless legs syndrome: Secondary | ICD-10-CM

## 2024-08-08 DIAGNOSIS — R32 Unspecified urinary incontinence: Secondary | ICD-10-CM | POA: Diagnosis not present

## 2024-08-08 MED ORDER — GABAPENTIN 100 MG PO CAPS: ORAL_CAPSULE | ORAL | 1 refills | Status: DC

## 2024-08-08 NOTE — Progress Notes (Signed)
 Established Patient Office Visit  Subjective    Patient ID: Thomas Mcbride, male    DOB: 03-Nov-1946  Age: 78 y.o. MRN: [id number]  CC:  Chief Complaint  Patient presents with   restless leg syndrome    Urinary Incontinence    HPI Thomas Mcbride presents with complaint of RLS and incontinence from meds prescribed by [facility].   Outpatient Encounter Medications as of 08/08/2024  Medication Sig   atorvastatin  (LIPITOR) 20 MG tablet Take 1 tablet (20 mg total) by mouth daily.   finasteride  (PROSCAR ) 5 MG tablet Take 5 mg by mouth daily.   FLUoxetine  (PROZAC ) 10 MG capsule Take 25 mg by mouth daily.   propranolol (INDERAL) 10 MG tablet Take 10 mg by mouth 3 (three) times daily.   risperiDONE  (RISPERDAL ) 3 MG tablet Take 3 mg by mouth at bedtime.   benztropine  (COGENTIN ) 0.5 MG tablet Take 0.5 mg by mouth at bedtime. (Patient not taking: Reported on 08/08/2024)   benztropine  (COGENTIN ) 1 MG tablet Take 1 mg by mouth at bedtime. (Patient not taking: Reported on 08/08/2024)   Coenzyme Q10 (CO Q-10) 100 MG CAPS Take 1 tablet by mouth 3 (three) times a week. Takes on Tuesdays, Thursdays and Saturdays.   Ferrous Sulfate (IRON PO) Take 1 tablet by mouth every other day.   Fluoxetine  HCl, PMDD, 10 MG TABS Take 10 mg by mouth.   GLUCOSAMINE-CHONDROITIN PO Take 1 tablet by mouth daily as needed (Occasionally when working out (4-6 monthly)).   hydrOXYzine (VISTARIL) 25 MG capsule Take 25-50 mg by mouth at bedtime as needed.   QUEtiapine  (SEROQUEL ) 100 MG tablet Take 1 tablet (100 mg total) by mouth at bedtime.   tamsulosin  (FLOMAX ) 0.4 MG CAPS capsule Take 1 capsule (0.4 mg total) by mouth daily.   No facility-administered encounter medications on file as of 08/08/2024.    Past Medical History:  Diagnosis Date   Allergy    Attention deficit hyperactivity disorder (ADHD)    BPH (benign prostatic hyperplasia)    Depression    Hyperlipidemia     Past Surgical History:  Procedure  Laterality Date   FRACTURE SURGERY  1972   leg   VASECTOMY      Family History  Problem Relation Age of Onset   Crohn's disease Mother    Cancer Mother        brain   Heart attack Father    Lymphoma Father    Cancer Father        marrow   Heart disease Father    Hyperlipidemia Father    Hypertension Father        ?   COPD Paternal Grandfather    Hypertension Sister     Social History   Socioeconomic History   Marital status: Divorced    Spouse name: Not on file   Number of children: 2   Years of education: Not on file   Highest education level: Not on file  Occupational History   Occupation: Quarry manager ASSOCIATE    Employer: [employer]  Tobacco Use   Smoking status: Never   Smokeless tobacco: Never  Vaping Use   Vaping status: Never Used  Substance and Sexual Activity   Alcohol use: No   Drug use: No   Sexual activity: Not Currently    Birth control/protection: None  Other Topics Concern   Not on file  Social History Narrative   Exercises 3 x's week on eliptical machine.  Social Drivers of Corporate investment banker Strain: Low Risk  (11/03/2023)   Overall Financial Resource Strain (CARDIA)    Difficulty of Paying Living Expenses: Not hard at all  Food Insecurity: No Food Insecurity (11/03/2023)   Hunger Vital Sign    Worried About Running Out of Food in the Last Year: Never true    Ran Out of Food in the Last Year: Never true  Transportation Needs: No Transportation Needs (11/03/2023)   PRAPARE - Administrator, Civil Service (Medical): No    Lack of Transportation (Non-Medical): No  Physical Activity: Sufficiently Active (11/03/2023)   Exercise Vital Sign    Days of Exercise per Week: 5 days    Minutes of Exercise per Session: 30 min  Stress: No Stress Concern Present (11/03/2023)   [employer] of Occupational Health - Occupational Stress Questionnaire    Feeling of Stress : Not at all  Social Connections: Moderately  Integrated (11/03/2023)   Social Connection and Isolation Panel    Frequency of Communication with Friends and Family: More than three times a week    Frequency of Social Gatherings with Friends and Family: Once a week    Attends Religious Services: More than 4 times per year    Active Member of [employer] or Organizations: Yes    Attends Banker Meetings: More than 4 times per year    Marital Status: Divorced  Intimate Partner Violence: Not At Risk (11/03/2023)   Humiliation, Afraid, Rape, and Kick questionnaire    Fear of Current or Ex-Partner: No    Emotionally Abused: No    Physically Abused: No    Sexually Abused: No    Review of Systems  All other systems reviewed and are negative.       Objective    BP 128/77   Pulse (!) 54   Ht 5' 7 (1.702 m)   Wt 132 lb 3.2 oz (60 kg)   SpO2 97%   BMI 20.71 kg/m   Physical Exam Vitals and nursing note reviewed.  Constitutional:      General: He is not in acute distress. Cardiovascular:     Rate and Rhythm: Normal rate and regular rhythm.  Pulmonary:     Effort: Pulmonary effort is normal.     Breath sounds: Normal breath sounds.  Neurological:     General: No focal deficit present.     Mental Status: He is alert and oriented to person, place, and time.         Assessment & Plan:   1. RLS (restless legs syndrome) (Primary) Neurontin  prescribed  2. Urinary incontinence, unspecified type  - Ambulatory referral to Urology   No follow-ups on file.   Tanda Raguel SQUIBB, MD

## 2024-08-16 DIAGNOSIS — G2401 Drug induced subacute dyskinesia: Secondary | ICD-10-CM | POA: Diagnosis not present

## 2024-08-16 DIAGNOSIS — F3342 Major depressive disorder, recurrent, in full remission: Secondary | ICD-10-CM | POA: Diagnosis not present

## 2024-08-16 DIAGNOSIS — F200 Paranoid schizophrenia: Secondary | ICD-10-CM | POA: Diagnosis not present

## 2024-08-16 DIAGNOSIS — F411 Generalized anxiety disorder: Secondary | ICD-10-CM | POA: Diagnosis not present

## 2024-09-04 ENCOUNTER — Other Ambulatory Visit: Payer: Self-pay | Admitting: Family Medicine

## 2024-09-04 ENCOUNTER — Telehealth: Payer: Self-pay | Admitting: Family Medicine

## 2024-09-04 NOTE — Telephone Encounter (Signed)
 Copied from CRM [id number]. Topic: Referral - Request for Referral >> Sep 04, 2024 11:42 AM Jasmin G wrote: Did the patient discuss referral with their provider in the last year? Yes (If No - schedule appointment) (If Yes - send message)  Appointment offered? No  Type of order/referral and detailed reason for visit: Urologist for incontinence, pt stated that he needs the referral sent ASAP and requested a call back at [phone] to see how soon he could be seen.  Preference of office, provider, location: Not given  If referral order, have you been seen by this specialty before? No (If Yes, this issue or another issue? When? Where?  Can we respond through MyChart? No

## 2024-09-04 NOTE — Telephone Encounter (Unsigned)
 Copied from CRM [id number]. Topic: Clinical - Medication Refill >> Sep 04, 2024 11:41 AM Jasmin G wrote: Medication: gabapentin  (NEURONTIN ) 100 MG capsule. Py requested 2 pills instead of 1.  Has the patient contacted their pharmacy? No (Agent: If no, request that the patient contact the pharmacy for the refill. If patient does not wish to contact the pharmacy document the reason why and proceed with request.) (Agent: If yes, when and what did the pharmacy advise?)  This is the patient's preferred pharmacy:  [facility] [address] ([address]), [place] - [address] [place] ([place]) [place] [address] Phone: [phone] Fax: [phone]  Is this the correct pharmacy for this prescription? Yes If no, delete pharmacy and type the correct one.   Has the prescription been filled recently? Yes  Is the patient out of the medication? No  Has the patient been seen for an appointment in the last year OR does the patient have an upcoming appointment? Yes  Can we respond through MyChart? No  Agent: Please be advised that Rx refills may take up to 3 business days. We ask that you follow-up with your pharmacy.

## 2024-09-04 NOTE — Telephone Encounter (Signed)
Does pt need appt for referral

## 2024-09-06 ENCOUNTER — Other Ambulatory Visit: Payer: Self-pay | Admitting: Family Medicine

## 2024-09-06 DIAGNOSIS — N401 Enlarged prostate with lower urinary tract symptoms: Secondary | ICD-10-CM

## 2024-09-06 NOTE — Telephone Encounter (Signed)
 Copied from CRM [id number]. Topic: Clinical - Medication Refill >> Sep 06, 2024 11:11 AM Rosaria E wrote: Medication: tamsulosin  (FLOMAX ) 0.4 MG CAPS capsule  Needs new prescription for 2 a day instead of once daily, says this was discussed with PCP.   Has the patient contacted their pharmacy? Yes (Agent: If no, request that the patient contact the pharmacy for the refill. If patient does not wish to contact the pharmacy document the reason why and proceed with request.) (Agent: If yes, when and what did the pharmacy advise?)  This is the patient's preferred pharmacy:  [facility] [address] ([address] [place] - [address] [place] ([place]) [place] [address] Phone: [phone] Fax: [phone]  Is this the correct pharmacy for this prescription? Yes If no, delete pharmacy and type the correct one.   Has the prescription been filled recently? Yes  Is the patient out of the medication? Yes  Has the patient been seen for an appointment in the last year OR does the patient have an upcoming appointment? Yes  Can we respond through MyChart? Yes  Agent: Please be advised that Rx refills may take up to 3 business days. We ask that you follow-up with your pharmacy.

## 2024-09-07 NOTE — Telephone Encounter (Signed)
 Requested medications are due for refill today.  No - see note  Requested medications are on the active medications list.  yes  Last refill. 05/02/2024 #90 3 rf  Future visit scheduled.   yes  Notes to clinic.  Needs new prescription for 2 a day instead of once daily, says this was discussed with PCP.    Requested Prescriptions  Pending Prescriptions Disp Refills   tamsulosin  (FLOMAX ) 0.4 MG CAPS capsule 90 capsule 3    Sig: Take 1 capsule (0.4 mg total) by mouth daily.     Urology: Alpha-Adrenergic Blocker Failed - 09/07/2024  3:33 PM      Failed - PSA in normal range and within 360 days    PSA  Date Value Ref Range Status  12/05/2015 1.95 <=4.00 ng/mL Final    Comment:    Test Methodology: ECLIA PSA (Electrochemiluminescence Immunoassay)   For PSA values from 2.5-4.0, particularly in younger men <22 years old, the AUA and NCCN suggest testing for % Free PSA (3515) and evaluation of the rate of increase in PSA (PSA velocity).    Prostate Specific Ag, Serum  Date Value Ref Range Status  09/09/2021 2.7 0.0 - 4.0 ng/mL Final    Comment:    Roche ECLIA methodology. According to the American Urological Association, Serum PSA should decrease and remain at undetectable levels after radical prostatectomy. The AUA defines biochemical recurrence as an initial PSA value 0.2 ng/mL or greater followed by a subsequent confirmatory PSA value 0.2 ng/mL or greater. Values obtained with different assay methods or kits cannot be used interchangeably. Results cannot be interpreted as absolute evidence of the presence or absence of malignant disease.          Passed - Last BP in normal range    BP Readings from Last 1 Encounters:  08/08/24 128/77         Passed - Valid encounter within last 12 months    Recent Outpatient Visits           1 month ago RLS (restless legs syndrome)   [facility], MD   4 months ago Essential hypertension    [facility], MD   10 months ago Essential hypertension   [place] [facility], MD   1 year ago Encounter for Medicare annual wellness exam   [facility], MD   2 years ago Benign prostatic hyperplasia with urinary hesitancy   [facility], [place]

## 2024-09-08 MED ORDER — GABAPENTIN 100 MG PO CAPS: ORAL_CAPSULE | ORAL | 1 refills | Status: AC

## 2024-09-08 MED ORDER — TAMSULOSIN HCL 0.4 MG PO CAPS: 0.4000 mg | ORAL_CAPSULE | Freq: Every day | ORAL | 3 refills | Status: AC

## 2024-09-08 NOTE — Telephone Encounter (Signed)
 Noted

## 2024-09-08 NOTE — Telephone Encounter (Signed)
 Copied from CRM #[id number]. Topic: Clinical - Prescription Issue >> Sep 08, 2024 11:18 AM Leonette SQUIBB wrote: Reason for CRM: spoke with cal and the front desk said they would let Dr Tanda know that the patients is waiting on the pending prescription Tamsulosin .

## 2024-09-11 ENCOUNTER — Telehealth: Payer: Self-pay

## 2024-09-11 NOTE — Telephone Encounter (Signed)
 Copied from CRM #[id number]. Topic: Clinical - Prescription Issue >> Sep 11, 2024 10:29 AM Antony RAMAN wrote: Reason for CRM: patients sister pam called today inquiring about the rx tamsulosin  (FLOMAX ) 0.4 MG CAPS capsule , the rx needs to say 2 a day for insurance to approve. Need new rx today

## 2024-09-12 ENCOUNTER — Ambulatory Visit: Payer: Self-pay

## 2024-09-12 ENCOUNTER — Telehealth: Payer: Self-pay

## 2024-09-12 NOTE — Telephone Encounter (Signed)
 Spoke to patient's sister and let her know that, as we discussed, you do not prescribe flomax  for twice a day. After looking in patient's chart I cannot find it ever being written for him to take twice daily, only once daily. Sister expressed understanding of this, but is concerned that he is now completely out of the medication but won't be able to pick up a new prescription for about 2 weeks. Sister is wondering if it is okay for him to go that long without the medication / what he should do?

## 2024-09-12 NOTE — Telephone Encounter (Signed)
 FYI Only or Action Required?: Action required by provider: clinical question for provider.  Patient was last seen in primary care on 08/08/2024 by Tanda Bleacher, MD.  Called Nurse Triage reporting Medication Refill.  Symptoms began No present symptoms.  Interventions attempted: Nothing.  Symptoms are: stable.  Triage Disposition: Call PCP Within 24 Hours  Patient/caregiver understands and will follow disposition?: Yes FYI: This RN escalated call to CAL because family has been requesting info since last week. Lacinda, CMA to consult with PCP and f/u with Patient sister/DPR Holley [phone]  who is managing patients medication.   Copied from CRM [id number]. Topic: Clinical - Prescription Issue >> Sep 12, 2024  8:33 AM Janeecia G wrote: Reason for CRM: wants prescription increasing the quantity of medication it she insist on talking to a nurse Reason for Disposition  [1] Caller requests to speak ONLY to PCP AND [2] NON-URGENT question  Answer Assessment - Initial Assessment Questions 1. REASON FOR CALL or QUESTION: What is your reason for calling today? or How can I best     Patient's sister Pam needing clartity on how patient should be taking his Flomax . The prescription say 1 capsule a days but patient says he was told to take 2 capsules a day.  Patient has now run out of the medication and Pam is unable to refill before mid-October.  Pam is asking to clarify the order and for an updated prescription if needed. If patient is to only take 1 capsule a day, Pam is asking can he go without the medication until the refill can be processed  2. CALLER: Document the source of call. (e.g., laboratory staff, caregiver or patient).     Pam, sister, [phone]  Protocols used: PCP Call - No Triage-A-AH

## 2024-09-25 DIAGNOSIS — N399 Disorder of urinary system, unspecified: Secondary | ICD-10-CM | POA: Diagnosis not present

## 2024-09-25 DIAGNOSIS — N200 Calculus of kidney: Secondary | ICD-10-CM | POA: Diagnosis not present

## 2024-09-25 DIAGNOSIS — N401 Enlarged prostate with lower urinary tract symptoms: Secondary | ICD-10-CM | POA: Diagnosis not present

## 2024-09-29 DIAGNOSIS — F3342 Major depressive disorder, recurrent, in full remission: Secondary | ICD-10-CM | POA: Diagnosis not present

## 2024-09-29 DIAGNOSIS — F411 Generalized anxiety disorder: Secondary | ICD-10-CM | POA: Diagnosis not present

## 2024-09-29 DIAGNOSIS — G2401 Drug induced subacute dyskinesia: Secondary | ICD-10-CM | POA: Diagnosis not present

## 2024-09-29 DIAGNOSIS — F200 Paranoid schizophrenia: Secondary | ICD-10-CM | POA: Diagnosis not present

## 2024-10-20 DIAGNOSIS — F200 Paranoid schizophrenia: Secondary | ICD-10-CM | POA: Diagnosis not present

## 2024-10-20 DIAGNOSIS — G2401 Drug induced subacute dyskinesia: Secondary | ICD-10-CM | POA: Diagnosis not present

## 2024-10-20 DIAGNOSIS — F411 Generalized anxiety disorder: Secondary | ICD-10-CM | POA: Diagnosis not present

## 2024-10-20 DIAGNOSIS — F3342 Major depressive disorder, recurrent, in full remission: Secondary | ICD-10-CM | POA: Diagnosis not present

## 2024-11-02 ENCOUNTER — Ambulatory Visit: Admitting: Family Medicine

## 2024-11-13 DIAGNOSIS — F200 Paranoid schizophrenia: Secondary | ICD-10-CM | POA: Diagnosis not present

## 2024-11-13 DIAGNOSIS — F411 Generalized anxiety disorder: Secondary | ICD-10-CM | POA: Diagnosis not present

## 2024-11-13 DIAGNOSIS — F3342 Major depressive disorder, recurrent, in full remission: Secondary | ICD-10-CM | POA: Diagnosis not present

## 2024-11-13 DIAGNOSIS — G2401 Drug induced subacute dyskinesia: Secondary | ICD-10-CM | POA: Diagnosis not present

## 2024-11-15 DIAGNOSIS — I10 Essential (primary) hypertension: Secondary | ICD-10-CM | POA: Diagnosis not present

## 2024-11-15 DIAGNOSIS — F319 Bipolar disorder, unspecified: Secondary | ICD-10-CM | POA: Diagnosis not present

## 2024-11-15 DIAGNOSIS — F5104 Psychophysiologic insomnia: Secondary | ICD-10-CM | POA: Diagnosis not present

## 2024-11-22 DIAGNOSIS — E7800 Pure hypercholesterolemia, unspecified: Secondary | ICD-10-CM | POA: Diagnosis not present

## 2024-11-22 DIAGNOSIS — N401 Enlarged prostate with lower urinary tract symptoms: Secondary | ICD-10-CM | POA: Diagnosis not present

## 2024-11-22 DIAGNOSIS — G2581 Restless legs syndrome: Secondary | ICD-10-CM | POA: Diagnosis not present

## 2024-11-22 DIAGNOSIS — R351 Nocturia: Secondary | ICD-10-CM | POA: Diagnosis not present

## 2024-11-22 DIAGNOSIS — Z6822 Body mass index (BMI) 22.0-22.9, adult: Secondary | ICD-10-CM | POA: Diagnosis not present

## 2024-12-04 DIAGNOSIS — F200 Paranoid schizophrenia: Secondary | ICD-10-CM | POA: Diagnosis not present

## 2024-12-04 DIAGNOSIS — F3342 Major depressive disorder, recurrent, in full remission: Secondary | ICD-10-CM | POA: Diagnosis not present

## 2024-12-04 DIAGNOSIS — F411 Generalized anxiety disorder: Secondary | ICD-10-CM | POA: Diagnosis not present

## 2024-12-04 DIAGNOSIS — G2401 Drug induced subacute dyskinesia: Secondary | ICD-10-CM | POA: Diagnosis not present
